# Patient Record
Sex: Female | Born: 1970 | Race: White | Hispanic: Yes | Marital: Single | Smoking: Never smoker
Health system: Southern US, Community
[De-identification: ages and names within clinical notes are randomized; demographics above are authoritative.]

## PROBLEM LIST (undated history)

## (undated) ENCOUNTER — Inpatient Hospital Stay (HOSPITAL_COMMUNITY): Payer: Self-pay

## (undated) DIAGNOSIS — G373 Acute transverse myelitis in demyelinating disease of central nervous system: Secondary | ICD-10-CM

## (undated) HISTORY — PX: NO PAST SURGERIES: SHX2092

## (undated) HISTORY — DX: Acute transverse myelitis in demyelinating disease of central nervous system: G37.3

---

## 2004-09-27 DIAGNOSIS — G0490 Encephalitis and encephalomyelitis, unspecified: Secondary | ICD-10-CM | POA: Insufficient documentation

## 2004-09-27 DIAGNOSIS — G0491 Myelitis, unspecified: Secondary | ICD-10-CM

## 2004-10-09 ENCOUNTER — Inpatient Hospital Stay (HOSPITAL_COMMUNITY): Admission: EM | Admit: 2004-10-09 | Discharge: 2004-10-13 | Payer: Self-pay | Admitting: Emergency Medicine

## 2004-10-09 ENCOUNTER — Ambulatory Visit: Payer: Self-pay | Admitting: Physical Medicine & Rehabilitation

## 2004-10-13 ENCOUNTER — Inpatient Hospital Stay (HOSPITAL_COMMUNITY)
Admission: RE | Admit: 2004-10-13 | Discharge: 2004-10-24 | Payer: Self-pay | Admitting: Physical Medicine & Rehabilitation

## 2004-10-27 ENCOUNTER — Ambulatory Visit: Payer: Self-pay | Admitting: Internal Medicine

## 2004-10-27 ENCOUNTER — Ambulatory Visit: Payer: Self-pay | Admitting: *Deleted

## 2004-11-13 ENCOUNTER — Ambulatory Visit: Payer: Self-pay | Admitting: Internal Medicine

## 2004-11-27 ENCOUNTER — Ambulatory Visit: Payer: Self-pay | Admitting: Internal Medicine

## 2004-12-02 ENCOUNTER — Encounter
Admission: RE | Admit: 2004-12-02 | Discharge: 2005-01-29 | Payer: Self-pay | Admitting: Physical Medicine & Rehabilitation

## 2004-12-03 ENCOUNTER — Ambulatory Visit: Payer: Self-pay | Admitting: Physical Medicine & Rehabilitation

## 2004-12-15 ENCOUNTER — Ambulatory Visit: Payer: Self-pay | Admitting: Internal Medicine

## 2005-01-29 ENCOUNTER — Encounter
Admission: RE | Admit: 2005-01-29 | Discharge: 2005-04-29 | Payer: Self-pay | Admitting: Physical Medicine & Rehabilitation

## 2005-01-30 ENCOUNTER — Ambulatory Visit: Payer: Self-pay | Admitting: Internal Medicine

## 2005-02-02 ENCOUNTER — Ambulatory Visit: Payer: Self-pay | Admitting: Physical Medicine & Rehabilitation

## 2005-03-27 ENCOUNTER — Ambulatory Visit: Payer: Self-pay | Admitting: Internal Medicine

## 2005-04-20 ENCOUNTER — Ambulatory Visit: Payer: Self-pay | Admitting: Internal Medicine

## 2005-04-27 ENCOUNTER — Encounter (INDEPENDENT_AMBULATORY_CARE_PROVIDER_SITE_OTHER): Payer: Self-pay | Admitting: Internal Medicine

## 2005-04-27 LAB — CONVERTED CEMR LAB

## 2005-05-04 ENCOUNTER — Encounter
Admission: RE | Admit: 2005-05-04 | Discharge: 2005-08-02 | Payer: Self-pay | Admitting: Physical Medicine & Rehabilitation

## 2005-05-15 ENCOUNTER — Ambulatory Visit: Payer: Self-pay | Admitting: Internal Medicine

## 2005-05-22 ENCOUNTER — Ambulatory Visit: Payer: Self-pay | Admitting: Internal Medicine

## 2005-05-22 DIAGNOSIS — A5601 Chlamydial cystitis and urethritis: Secondary | ICD-10-CM

## 2006-01-29 ENCOUNTER — Ambulatory Visit: Payer: Self-pay | Admitting: Family Medicine

## 2006-02-26 ENCOUNTER — Ambulatory Visit: Payer: Self-pay | Admitting: Internal Medicine

## 2006-03-11 ENCOUNTER — Ambulatory Visit: Payer: Self-pay | Admitting: Internal Medicine

## 2006-03-28 DIAGNOSIS — O039 Complete or unspecified spontaneous abortion without complication: Secondary | ICD-10-CM | POA: Insufficient documentation

## 2006-03-31 ENCOUNTER — Ambulatory Visit: Payer: Self-pay | Admitting: Obstetrics & Gynecology

## 2007-04-18 ENCOUNTER — Ambulatory Visit: Payer: Self-pay | Admitting: Family Medicine

## 2007-04-22 ENCOUNTER — Ambulatory Visit: Payer: Self-pay | Admitting: Internal Medicine

## 2007-04-22 DIAGNOSIS — J309 Allergic rhinitis, unspecified: Secondary | ICD-10-CM | POA: Insufficient documentation

## 2007-05-29 ENCOUNTER — Encounter (INDEPENDENT_AMBULATORY_CARE_PROVIDER_SITE_OTHER): Payer: Self-pay | Admitting: Internal Medicine

## 2007-05-29 LAB — CONVERTED CEMR LAB

## 2007-06-06 ENCOUNTER — Encounter: Payer: Self-pay | Admitting: Internal Medicine

## 2007-06-06 ENCOUNTER — Ambulatory Visit: Payer: Self-pay | Admitting: Internal Medicine

## 2007-09-14 ENCOUNTER — Encounter (INDEPENDENT_AMBULATORY_CARE_PROVIDER_SITE_OTHER): Payer: Self-pay | Admitting: *Deleted

## 2007-09-15 ENCOUNTER — Encounter (INDEPENDENT_AMBULATORY_CARE_PROVIDER_SITE_OTHER): Payer: Self-pay | Admitting: Internal Medicine

## 2007-09-15 DIAGNOSIS — R32 Unspecified urinary incontinence: Secondary | ICD-10-CM | POA: Insufficient documentation

## 2007-09-15 DIAGNOSIS — Z8659 Personal history of other mental and behavioral disorders: Secondary | ICD-10-CM

## 2008-02-02 ENCOUNTER — Ambulatory Visit: Payer: Self-pay | Admitting: Internal Medicine

## 2008-02-02 DIAGNOSIS — N319 Neuromuscular dysfunction of bladder, unspecified: Secondary | ICD-10-CM | POA: Insufficient documentation

## 2008-02-02 LAB — CONVERTED CEMR LAB
Bilirubin Urine: NEGATIVE
Blood in Urine, dipstick: NEGATIVE
Glucose, Urine, Semiquant: NEGATIVE
Ketones, urine, test strip: NEGATIVE
Nitrite: NEGATIVE
Protein, U semiquant: NEGATIVE
Specific Gravity, Urine: 1.01
Urobilinogen, UA: NEGATIVE
WBC Urine, dipstick: NEGATIVE
pH: 5.5

## 2008-04-02 ENCOUNTER — Telehealth (INDEPENDENT_AMBULATORY_CARE_PROVIDER_SITE_OTHER): Payer: Self-pay | Admitting: *Deleted

## 2008-04-20 ENCOUNTER — Ambulatory Visit: Payer: Self-pay | Admitting: Nurse Practitioner

## 2008-07-20 ENCOUNTER — Encounter (INDEPENDENT_AMBULATORY_CARE_PROVIDER_SITE_OTHER): Payer: Self-pay | Admitting: Internal Medicine

## 2008-07-20 ENCOUNTER — Ambulatory Visit: Payer: Self-pay | Admitting: Internal Medicine

## 2008-07-20 DIAGNOSIS — M79609 Pain in unspecified limb: Secondary | ICD-10-CM

## 2008-07-20 LAB — CONVERTED CEMR LAB
ALT: 16 units/L (ref 0–35)
AST: 18 units/L (ref 0–37)
Albumin: 4.2 g/dL (ref 3.5–5.2)
Alkaline Phosphatase: 60 units/L (ref 39–117)
BUN: 14 mg/dL (ref 6–23)
Basophils Absolute: 0 10*3/uL (ref 0.0–0.1)
Basophils Relative: 1 % (ref 0–1)
Bilirubin Urine: NEGATIVE
Blood in Urine, dipstick: NEGATIVE
CO2: 23 meq/L (ref 19–32)
Calcium: 8.9 mg/dL (ref 8.4–10.5)
Chlamydia, DNA Probe: NEGATIVE
Chloride: 107 meq/L (ref 96–112)
Cholesterol: 129 mg/dL (ref 0–200)
Creatinine, Ser: 0.72 mg/dL (ref 0.40–1.20)
Eosinophils Absolute: 0.1 10*3/uL (ref 0.0–0.7)
Eosinophils Relative: 1 % (ref 0–5)
GC Probe Amp, Genital: NEGATIVE
Glucose, Bld: 89 mg/dL (ref 70–99)
Glucose, Urine, Semiquant: NEGATIVE
HCT: 38.2 % (ref 36.0–46.0)
HDL: 47 mg/dL (ref >39)
Hemoglobin: 13 g/dL (ref 12.0–15.0)
KOH Prep: NEGATIVE
Ketones, urine, test strip: NEGATIVE
LDL Cholesterol: 62 mg/dL (ref 0–99)
Lymphocytes Relative: 28 % (ref 12–46)
Lymphs Abs: 1.8 10*3/uL (ref 0.7–4.0)
MCHC: 34 g/dL (ref 30.0–36.0)
MCV: 90.5 fL (ref 78.0–100.0)
Monocytes Absolute: 0.5 10*3/uL (ref 0.1–1.0)
Monocytes Relative: 8 % (ref 3–12)
Neutro Abs: 4.2 10*3/uL (ref 1.7–7.7)
Neutrophils Relative %: 63 % (ref 43–77)
Nitrite: NEGATIVE
Platelets: 172 10*3/uL (ref 150–400)
Potassium: 3.8 meq/L (ref 3.5–5.3)
Protein, U semiquant: 30
RBC: 4.22 M/uL (ref 3.87–5.11)
RDW: 13.1 % (ref 11.5–15.5)
Sodium: 141 meq/L (ref 135–145)
Specific Gravity, Urine: 1.01
Total Bilirubin: 0.6 mg/dL (ref 0.3–1.2)
Total CHOL/HDL Ratio: 2.7
Total Protein: 7.1 g/dL (ref 6.0–8.3)
Triglycerides: 99 mg/dL (ref <150)
Urobilinogen, UA: 2
VLDL: 20 mg/dL (ref 0–40)
WBC Urine, dipstick: NEGATIVE
WBC: 6.6 10*3/uL (ref 4.0–10.5)
Whiff Test: POSITIVE
pH: 8

## 2008-07-24 ENCOUNTER — Encounter (INDEPENDENT_AMBULATORY_CARE_PROVIDER_SITE_OTHER): Payer: Self-pay | Admitting: Internal Medicine

## 2008-07-26 ENCOUNTER — Encounter (INDEPENDENT_AMBULATORY_CARE_PROVIDER_SITE_OTHER): Payer: Self-pay | Admitting: Internal Medicine

## 2008-12-25 ENCOUNTER — Ambulatory Visit: Payer: Self-pay | Admitting: Internal Medicine

## 2009-01-07 ENCOUNTER — Telehealth (INDEPENDENT_AMBULATORY_CARE_PROVIDER_SITE_OTHER): Payer: Self-pay | Admitting: Internal Medicine

## 2009-01-10 ENCOUNTER — Encounter: Admission: RE | Admit: 2009-01-10 | Discharge: 2009-02-07 | Payer: Self-pay | Admitting: Internal Medicine

## 2009-01-14 ENCOUNTER — Encounter (INDEPENDENT_AMBULATORY_CARE_PROVIDER_SITE_OTHER): Payer: Self-pay | Admitting: Internal Medicine

## 2009-01-21 ENCOUNTER — Encounter (INDEPENDENT_AMBULATORY_CARE_PROVIDER_SITE_OTHER): Payer: Self-pay | Admitting: Internal Medicine

## 2009-01-29 ENCOUNTER — Encounter (INDEPENDENT_AMBULATORY_CARE_PROVIDER_SITE_OTHER): Payer: Self-pay | Admitting: Internal Medicine

## 2009-01-29 LAB — CONVERTED CEMR LAB: Pap Smear: NORMAL

## 2009-02-20 ENCOUNTER — Ambulatory Visit (HOSPITAL_COMMUNITY): Admission: RE | Admit: 2009-02-20 | Discharge: 2009-02-20 | Payer: Self-pay | Admitting: Family Medicine

## 2009-02-20 ENCOUNTER — Encounter (INDEPENDENT_AMBULATORY_CARE_PROVIDER_SITE_OTHER): Payer: Self-pay | Admitting: Internal Medicine

## 2009-03-05 ENCOUNTER — Ambulatory Visit (HOSPITAL_COMMUNITY): Admission: RE | Admit: 2009-03-05 | Discharge: 2009-03-06 | Payer: Self-pay

## 2009-04-16 ENCOUNTER — Ambulatory Visit (HOSPITAL_COMMUNITY): Admission: RE | Admit: 2009-04-16 | Discharge: 2009-04-16 | Payer: Self-pay | Admitting: Family Medicine

## 2009-05-06 ENCOUNTER — Ambulatory Visit (HOSPITAL_COMMUNITY): Admission: RE | Admit: 2009-05-06 | Discharge: 2009-05-06 | Payer: Self-pay | Admitting: Family Medicine

## 2009-07-26 ENCOUNTER — Encounter (INDEPENDENT_AMBULATORY_CARE_PROVIDER_SITE_OTHER): Payer: Self-pay | Admitting: Internal Medicine

## 2009-07-26 ENCOUNTER — Ambulatory Visit: Payer: Self-pay | Admitting: Obstetrics & Gynecology

## 2009-08-02 ENCOUNTER — Inpatient Hospital Stay (HOSPITAL_COMMUNITY): Admission: RE | Admit: 2009-08-02 | Discharge: 2009-08-04 | Payer: Self-pay | Admitting: Family Medicine

## 2009-08-02 ENCOUNTER — Ambulatory Visit: Payer: Self-pay | Admitting: Advanced Practice Midwife

## 2010-04-03 ENCOUNTER — Ambulatory Visit: Payer: Self-pay | Admitting: Internal Medicine

## 2010-04-22 ENCOUNTER — Encounter (INDEPENDENT_AMBULATORY_CARE_PROVIDER_SITE_OTHER): Payer: Self-pay | Admitting: Internal Medicine

## 2010-05-01 ENCOUNTER — Encounter (INDEPENDENT_AMBULATORY_CARE_PROVIDER_SITE_OTHER): Payer: Self-pay | Admitting: Internal Medicine

## 2010-06-16 ENCOUNTER — Ambulatory Visit: Payer: Self-pay | Admitting: Internal Medicine

## 2010-06-16 DIAGNOSIS — N920 Excessive and frequent menstruation with regular cycle: Secondary | ICD-10-CM

## 2010-06-19 LAB — CONVERTED CEMR LAB: hCG, Beta Chain, Quant, S: 900.2 milliintl units/mL

## 2010-06-23 ENCOUNTER — Ambulatory Visit: Payer: Self-pay | Admitting: Internal Medicine

## 2010-06-26 ENCOUNTER — Encounter (INDEPENDENT_AMBULATORY_CARE_PROVIDER_SITE_OTHER): Payer: Self-pay | Admitting: Internal Medicine

## 2010-06-26 LAB — CONVERTED CEMR LAB
Preg, Serum: POSITIVE
hCG, Beta Chain, Quant, S: 88.3 milliintl units/mL

## 2010-06-27 ENCOUNTER — Ambulatory Visit: Payer: Self-pay | Admitting: Internal Medicine

## 2010-06-27 DIAGNOSIS — O039 Complete or unspecified spontaneous abortion without complication: Secondary | ICD-10-CM | POA: Insufficient documentation

## 2010-07-14 ENCOUNTER — Encounter (INDEPENDENT_AMBULATORY_CARE_PROVIDER_SITE_OTHER): Payer: Self-pay | Admitting: Internal Medicine

## 2010-07-23 LAB — CONVERTED CEMR LAB: hCG, Beta Chain, Quant, S: 34.7 milliintl units/mL

## 2010-07-25 ENCOUNTER — Ambulatory Visit: Payer: Self-pay | Admitting: Internal Medicine

## 2010-07-26 LAB — CONVERTED CEMR LAB: hCG, Beta Chain, Quant, S: <2 milliintl units/mL

## 2011-01-27 NOTE — Letter (Signed)
Summary: REQUESTING RECORDS FROM [facility]  REQUESTING RECORDS FROM [facility]   Imported By: Arta Bruce 05/06/2010 14:42:48  _____________________________________________________________________  External Attachment:    Type:   Image     Comment:   External Document

## 2011-01-27 NOTE — Assessment & Plan Note (Signed)
Summary: *? MISCARRIAGE  / NS   Vital Signs:  Patient profile:   40 year old female LMP:     06/16/2010 Weight:      152 pounds Temp:     98.5 degrees F Pulse rate:   65 / minute Pulse rhythm:   regular Resp:     20 per minute BP sitting:   93 / 63  (left arm) Cuff size:   regular  Vitals Entered By: Vesta Mixer CMA (June 16, 2010 3:21 PM) CC: Had a miscarrage apparently ans was told to f/u here from the hosp.  [facility] hospital Is Patient Diabetic? No Pain Assessment Patient in pain? no       Does patient need assistance? Ambulation Normal LMP (date): 06/16/2010     Enter LMP: 06/16/2010 Last PAP Result Done   CC:  Had a miscarrage apparently ans was told to f/u here from the hosp.  [facility] hospital.  History of Present Illness: Pt. seen yesterday at [facility] ED for excessive uterine bleeding--lots of clots.  Pt. received a Depo provera injection back in April.  Pt. was told she had a positive pregnancy test, but ultrasound did not show anything per boyfriend.  Bleeding still more than usual, but not nearly as bad as yesterday.  Some cramping in low back bilaterally.  Allergies (verified): No Known Drug Allergies  Physical Exam  General:  NAD Lungs:  Normal respiratory effort, chest expands symmetrically. Lungs are clear to auscultation, no crackles or wheezes. Heart:  Normal rate and regular rhythm. S1 and S2 normal without gallop, murmur, click, rub or other extra sounds. Abdomen:  Bowel sounds positive,abdomen soft and non-tender without masses, organomegaly or hernias noted.  No obvious uterine enlargement   Impression & Recommendations:  Problem # 1:  EXCESSIVE MENSTRUAL BLEEDING (ICD-626.2) Will need to send for records--sound like an ultrasound was done that did not show "anything"  per boyfriend.   Orders: T-Pregnancy (Serum), Quant. [phone])  Complete Medication List: 1)  Flonase 50 Mcg/act Susp (Fluticasone propionate) .... 2 sprays  each nostril  daily 2)  Neurontin 300 Mg Caps (Gabapentin) .... Increase by one cap every 3 days until taking 1 cap by mouth three times a day 3)  Singulair 10 Mg Tabs (Montelukast sodium) .Marland Kitchen.. 1 tab by mouth daily as needed seasonal allergies 4)  Cetirizine Hcl 10 Mg Tabs (Cetirizine hcl) .Marland Kitchen.. 1 tab daily  Patient Instructions: 1)  Release of info from [facility] ED--for uterine bleeding. 2)  Ibuprofen or Aleve for cramping. 3)  Use condoms until follow up here in July

## 2011-01-27 NOTE — Assessment & Plan Note (Signed)
Summary: follow up with Dr Breck Coons myesitus and next depo...   Vital Signs:  Patient profile:   40 year old female Weight:      152 pounds Temp:     98.1 degrees F Pulse rate:   73 / minute Pulse rhythm:   regular Resp:     18 per minute BP sitting:   101 / 67  (left arm) Cuff size:   regular  Vitals Entered By: Vesta Mixer CMA (June 27, 2010 3:15 PM) CC: f/u transverse myelitis/depo Is Patient Diabetic? No Pain Assessment Patient in pain? no       Does patient need assistance? Ambulation Normal   CC:  f/u transverse myelitis/depo.  History of Present Illness: 1.  SAB:  HCG dropping--from 900 to 88.5 in a week.  Have not seen paperwork from her hospital visit yet.  Bleeding is decreasing gradually  Allergies (verified): No Known Drug Allergies  Physical Exam  General:  NAD   Impression & Recommendations:  Problem # 1:  ABORTION, SPONTANEOUS (ICD-634.90) When reaches 0 or near 0, will start OCP--pt. has no requests for a certain pill Orders: T-Pregnancy (Serum), Quant. [phone])  Complete Medication List: 1)  Flonase 50 Mcg/act Susp (Fluticasone propionate) .... 2 sprays each nostril  daily 2)  Neurontin 300 Mg Caps (Gabapentin) .... Increase by one cap every 3 days until taking 1 cap by mouth three times a day 3)  Singulair 10 Mg Tabs (Montelukast sodium) .Marland Kitchen.. 1 tab by mouth daily as needed seasonal allergies 4)  Cetirizine Hcl 10 Mg Tabs (Cetirizine hcl) .Marland Kitchen.. 1 tab daily  Patient Instructions: 1)  Call [phone] and ask for [facility] to see if can get in there (husband)  Ask them if they do vasectomies--if so, see if qualify to be seen there. 2)  Will call with results and whether can start birth control pills

## 2011-01-27 NOTE — Assessment & Plan Note (Signed)
Summary: renew medication///gk   Vital Signs:  Patient profile:   40 year old female Weight:      155.38 pounds BMI:     28.99 Temp:     98.1 degrees F Pulse rate:   57 / minute Pulse rhythm:   regular Resp:     20 per minute BP sitting:   105 / 70  (left arm) Cuff size:   regular  Vitals Entered By: Chauncy Passy, SMA CC: Pt. here b/c she wants to renew/change some meds. She states she would like to have the depo shot instead of the pills.  Is Patient Diabetic? No Pain Assessment Patient in pain? no       Does patient need assistance? Functional Status Self care Ambulation Normal   CC:  Pt. here b/c she wants to renew/change some meds. She states she would like to have the depo shot instead of the pills. .  History of Present Illness: 1.  Allergies:  Controlled with Allegra, which she is out of, Singulair, and Flonase.  Needs refills of meds.  2.  Transverse Myelitis:  Neurontin helps with muscle spasms--would like to restart.  Has not taken since became pregnant with 39 mos old son.  Also suffers from burning and cold in feet.  Neurontin helped with that as well.  Continues with urine and stool incontinence--felt that was also better on Neurontin--sounds like has more of urge incontinence--unable to sustain sphincter pressure.    3.  Birth Control:  Not takiing any birth control currently and would like to get on Depo Provera--has used in years past without any problems.  Has not had intercourse recently.  Last gynecological exam--9/10 at [facility]  Current Medications (verified): 1)  Desogen 0.15-30 Mg-Mcg  Tabs (Desogestrel-Ethinyl Estradiol) .Marland Kitchen.. 1 Tab By Mouth Daily As Directed 2)  Flonase 50 Mcg/act  Susp (Fluticasone Propionate) .... 2 Sprays Each Nostril  Daily 3)  Allegra 180 Mg  Tabs (Fexofenadine Hcl) .Marland Kitchen.. 1 Tablet By Mouth Daily 4)  Neurontin 300 Mg  Caps (Gabapentin) .... Increase By One Cap Every 3 Days Until Taking 2 Caps By Mouth Three Times A Day 5)  Singulair 10  Mg  Tabs (Montelukast Sodium) .Marland Kitchen.. 1 Tab By Mouth Daily As Needed Seasonal Allergies 6)  Prenatal Vitamins 0.8 Mg Tabs (Prenatal Multivit-Min-Fe-Fa) .Marland Kitchen.. 1 Tab By Mouth Daily  Allergies (verified): No Known Drug Allergies  Physical Exam  General:  NAD Lungs:  Normal respiratory effort, chest expands symmetrically. Lungs are clear to auscultation, no crackles or wheezes. Heart:  Normal rate and regular rhythm. S1 and S2 normal without gallop, murmur, click, rub or other extra sounds.  Radial pulses normal and equal. Abdomen:  Bowel sounds positive,abdomen soft and non-tender without masses, organomegaly or hernias noted. Neurologic:  alert & oriented X3 and cranial nerves II-XII intact.  Motor strength in bilateral lower estrems 4+/5.  DTRs in LE 3+/4--particularly patellar.  Ankle reflexes 2+/4   Impression & Recommendations:  Problem # 1:  ALLERGIC RHINITIS (ICD-477.9) Refill meds and switch to Cetirizine as Allegra no longer available The following medications were removed from the medication list:    Allegra 180 Mg Tabs (Fexofenadine hcl) .Marland Kitchen... 1 tablet by mouth daily Her updated medication list for this problem includes:    Flonase 50 Mcg/act Susp (Fluticasone propionate) .Marland Kitchen... 2 sprays each nostril  daily    Cetirizine Hcl 10 Mg Tabs (Cetirizine hcl) .Marland Kitchen... 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 1 tab by mouth daily as directed 2)  Flonase 50 Mcg/act Susp (Fluticasone propionate) .... 2 sprays each nostril  daily 3)  Neurontin 300 Mg Caps (Gabapentin) .... Increase by one cap every 3 days until taking 1 cap by mouth three times a day 4)  Singulair 10 Mg Tabs (Montelukast sodium) .Marland Kitchen.. 1 tab by mouth daily as needed seasonal allergies 5)  Cetirizine Hcl 10 Mg Tabs (Cetirizine hcl) .Marland Kitchen.. 1 tab daily  Other Orders: Urine Pregnancy Test   [phone])  Patient Instructions: 1)  Release of info for gyn exam at [facility]--should be in September 2)  Follow up with Dr. Delrae Alfred in 3 months --Transverse myelitis and next Depo provera 3)  CPP end of September/beginning of October Prescriptions: NEURONTIN 300 MG  CAPS (GABAPENTIN) Increase by one cap every 3 days until taking 1 cap by mouth three times a day  #90 x 11   Entered and Authorized by:   Julieanne Manson MD   Signed by:   Julieanne Manson MD on 04/03/2010   Method used:   Faxed to ...       [facility] (retail)       [address] [place], [place]  [address]       Ph: [phone]       Fax: [phone]   RxID:   [id number] CETIRIZINE HCL 10 MG TABS (CETIRIZINE HCL) 1 tab daily  #30 x 11   Entered and Authorized by:   Julieanne Manson MD   Signed by:   Julieanne Manson MD on 04/03/2010   Method used:   Faxed to ...       [facility] (retail)       [address] [place], [place]  [address]       Ph: [phone]       Fax: [phone]   RxID:   [id number]   Appended Document: renew medication///gk     Allergies: No Known Drug Allergies   Impression & Recommendations:  Problem # 1:  CONTRACEPTIVE MANAGEMENT (ICD-V25.09)  Urine pregnancy--if negative--start Depo provera  Orders: Admin of Therapeutic Inj  intramuscular or subcutaneous (73419) Depo-Provera 150mg  (J1055)  Complete Medication List: 1)  Flonase 50 Mcg/act Susp (Fluticasone propionate) .... 2 sprays each nostril  daily 2)  Neurontin 300 Mg Caps (Gabapentin) .... Increase by one cap every 3 days until taking 1 cap by mouth three times a day 3)  Singulair 10 Mg Tabs (Montelukast sodium) .Marland Kitchen.. 1 tab by mouth daily as needed seasonal allergies 4)  Cetirizine Hcl 10 Mg Tabs (Cetirizine hcl) .Marland Kitchen.. 1 tab daily  Laboratory Results   Urine Tests      Urine HCG:  negative      Medication Administration  Injection # 1:    Medication: Depo-Provera 150mg     Diagnosis: CONTRACEPTIVE MANAGEMENT (ICD-V25.09)    Route: IM    Site: L deltoid    Exp Date: 05/27/2012    Lot #: [id number]    Mfr: Pharmacia    Comments: NDC - 3790-2409-73 Next shot due June 29    Patient tolerated injection without complications    Given by: Chauncy Passy SMA  Orders Added: 1)  Admin of Therapeutic Inj  intramuscular or subcutaneous [96372] 2)  Depo-Provera 150mg  [J1055]

## 2011-02-23 ENCOUNTER — Encounter (INDEPENDENT_AMBULATORY_CARE_PROVIDER_SITE_OTHER): Payer: Self-pay | Admitting: Internal Medicine

## 2011-02-23 DIAGNOSIS — D696 Thrombocytopenia, unspecified: Secondary | ICD-10-CM | POA: Insufficient documentation

## 2011-03-05 NOTE — Letter (Signed)
Summary: RECORDS RECEIVED FROM [facility] RECEIVED FROM [facility]   Imported By: Arta Bruce 02/24/2011 12:45:13  _____________________________________________________________________  External Attachment:    Type:   Image     Comment:   External Document

## 2011-03-05 NOTE — Miscellaneous (Signed)
Summary: [facility] records review.  Clinical Lists Changes  Problems: Added new problem of History of  THROMBOCYTOPENIA (ICD-287.5) Observations: Added new observation of PAP SMEAR: Normal at [facility] (01/29/2009 22:46)

## 2011-04-04 LAB — CBC
HCT: 31 % — ABNORMAL LOW (ref 36.0–46.0)
HCT: 37.1 % (ref 36.0–46.0)
Hemoglobin: 10.9 g/dL — ABNORMAL LOW (ref 12.0–15.0)
Hemoglobin: 12.8 g/dL (ref 12.0–15.0)
MCHC: 34.6 g/dL (ref 30.0–36.0)
MCHC: 35.2 g/dL (ref 30.0–36.0)
MCV: 97.9 fL (ref 78.0–100.0)
MCV: 98.3 fL (ref 78.0–100.0)
Platelets: 92 10*3/uL — ABNORMAL LOW (ref 150–400)
Platelets: 98 10*3/uL — ABNORMAL LOW (ref 150–400)
RBC: 3.16 MIL/uL — ABNORMAL LOW (ref 3.87–5.11)
RBC: 3.79 MIL/uL — ABNORMAL LOW (ref 3.87–5.11)
RDW: 14.4 % (ref 11.5–15.5)
RDW: 14.7 % (ref 11.5–15.5)
WBC: 8.3 10*3/uL (ref 4.0–10.5)
WBC: 9 10*3/uL (ref 4.0–10.5)

## 2011-04-04 LAB — RPR: RPR Ser Ql: NONREACTIVE

## 2011-05-15 ENCOUNTER — Other Ambulatory Visit (HOSPITAL_COMMUNITY): Payer: Self-pay | Admitting: Internal Medicine

## 2011-05-15 ENCOUNTER — Other Ambulatory Visit: Payer: Self-pay | Admitting: Internal Medicine

## 2011-05-15 DIAGNOSIS — N6459 Other signs and symptoms in breast: Secondary | ICD-10-CM

## 2011-05-15 DIAGNOSIS — Z1231 Encounter for screening mammogram for malignant neoplasm of breast: Secondary | ICD-10-CM

## 2011-05-15 NOTE — Assessment & Plan Note (Signed)
[name] returns to the clinic today for followup evaluation after a recent  bout of transverse myelitis.  She was on the rehabilitation unit between  October 13, 2004 and October 24, 2004 after initial onset October 08, 2004.   At this time, the patient reports that she still has tightness of her low  back and into her buttocks bilaterally. She reports that the tightness is  present all the time.  She does report that she feels like she has to walk  cautiously as she has weakness in her legs and could fall periodically. She  reports that she had fallen last week but did not injure herself. She does  report that the swollen feeling that she has across her abdomen and her  pelvis is less evident than it was previously. All of this is explained in  Spanish as she is not conversant in [place].   MEDICATIONS:  Neurontin 300 mg q.d.   PHYSICAL EXAMINATION:  A well appearing fit adult female in mild acute  discomfort. Blood pressure 85/58 with pulse of 85, respiratory rate 16 and  O2 saturation 100% on room air.  She has very poor [place] skills and we  discussed most of her problems in Spanish in the office today. There is only  mild problem understanding her when she speaks quickly.  She does have 5-/5  strength throughout the bilateral upper and lower extremities. Bulk and tone  were normal.  Sensation was only slightly decreased to light touch  throughout the bilateral lower extremities.   IMPRESSION:  Recent transverse myelitis, improved.   In the clinic today, we did refill her Neurontin at 300 mg q.d.  I have  cautioned her about rapid recovery and the fact that she will still be  recovering probably several months from now. With that in place, we have  recommended a followup in approximately 2-3 months time. She initially had  her onset in October of 2005 and I would expect her to continue to make  improvement through October 2006. Will plan on seeing her in followup as  noted  above.      DC/MedQ  D:  02/02/2005 11:57:55  T:  02/02/2005 12:36:34  Job #:  [id number]

## 2011-05-15 NOTE — Op Note (Signed)
NAMEMACKENZI, [name]                 ACCOUNT NO.:  [id number]   MEDICAL RECORD NO.:  [id number]          PATIENT TYPE:  EMS   LOCATION:  MAJO                         FACILITY:  [facility]   PHYSICIAN:  Michael L. Reynolds, M.D.DATE OF BIRTH:  [birth year]   DATE OF PROCEDURE:  10/09/2004  DATE OF DISCHARGE:                                 OPERATIVE REPORT   PROCEDURE:  Diagnostic lumbar puncture.   INDICATIONS:  Paraparesis, suspected myelitis.   OPERATOR:  Michael L. Reynolds, M.D.   DESCRIPTION OF PROCEDURE:  Informed consent was obtained after the procedure  risks and benefits were explained to the patient and she agreed to proceed.  Interpreter was present for this discussion.  The patient was placed in the  right lateral decubitus position and prepped and draped in the usual sterile  fashion.  Local anesthesia was achieved at the L3-4 interspace with 2 mL of  lidocaine.  Several passes with a 20-gauge spinal needle were made at the L3-  4 level without return of CSF.  The patient was then reanesthetized with 3  mL of lidocaine at the L4-5 level and on the first pass of the spinal needle  at this level, clear CSF was obtained.  Opening pressure was 135 mmH2O.  Approximately 8 mL of fluid was withdrawn and sent for laboratory analysis  as follows:  Tube #1, 2 mL, cell count and differential; tube #2, 2 mL,  glucose, protein, oligoclonal banding analysis; tube #3, 2 mL, Gram stain  and routine culture; tube #4, 2 mL, to be held in the lab for further  testing as needed.  Closing pressure was measured at 75 mmH2O.  The needle  was withdrawn.  Hemostasis was obtained.  No immediate complications were  noted.       MLR/MEDQ  D:  10/09/2004  T:  10/09/2004  Job:  [id number]

## 2011-05-15 NOTE — Discharge Summary (Signed)
NAMECHRISHAWNA, FARINA                 ACCOUNT NO.:  [id number]   MEDICAL RECORD NO.:  [id number]          PATIENT TYPE:  INP   LOCATION:  3034                         FACILITY:  [facility]   PHYSICIAN:  Casimiro Needle L. 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   DISCHARGE DIAGNOSES:  1.  Transverse myelitis.  2.  Neuropathic pain secondary to #1.  3.  Urinary urgency secondary to #1.  4.  Mild spasticity secondary to #1.   CONDITION AT DISCHARGE:  Improved.   DIET:  Regular.   ACTIVITY:  Activity is ad lib per rehab team.   MEDICATIONS AT DISCHARGE:  1.  Prednisone 60 mg p.o. daily.  2.  Protonix 40 mg p.o. daily.  3.  Lovenox 40 mg subcu daily.  4.  Baclofen 5 mg t.i.d.   STUDIES:  1.  MRI of the thoracic spine, October 09, 2004:  Normal examination.  2.  CT of the lumbosacral spine, October 08, 2004:  Unremarkable.  3.  MRI of the brain with and without contrast, October 09, 2004:  Normal.  4.  MRI of the thoracic spine, October 12, 2004:  Normal.   LABORATORY REVIEW:  CBC normal on admission.  Routine chemistries are  remarkable for elevated glucose due to steroids, elevated chloride levels of  uncertain etiology.  TSH normal.  HIV negative.  Urine drug screen negative.  LP demonstrating 4 white cells and 120 red blood cells, normal protein,  normal glucose, negative oligoclonal bands, negative cultures.  RPR  negative.  ANA negative.   HOSPITAL COURSE:  Please see admission H&P for full admission details.  Briefly, this is a 40 year old woman with little past medical history, who  presented with a subacute paraparesis.  On clinical grounds, she was felt to  have transverse myelitis; imaging studies and LP, however, did not  demonstrate any particular evidence for this.  She was treated with  intravenous steroids for 5 days.  She had complained of some urinary  retention as well as some neuropathic pain and mild spasticity which were  treated symptomatically.  By the 5th day of IV steroids, she was feeling  somewhat better; also, she was showing some improvement on examination,  although she still have leg weakness and bilateral Babinski's signs.  She  was felt to be stable for rehab transfer and was transferred to rehab on  October 13, 2004.   FOLLOWUP:  She will follow up with her rehab physicians as scheduled and  once discharged from rehab, will follow up at [facility]  in several weeks.       MLR/MEDQ  D:  11/27/2004  T:  11/27/2004  Job:  [id number]

## 2011-05-15 NOTE — Assessment & Plan Note (Signed)
HISTORY OF PRESENT ILLNESS:  [name] returns to the clinic today for  followup evaluation accompanied by her husband. The patient is a 40 year old  female with an insignificant past medical history. She was admitted on  October 08, 2004 with numbness across her lower abdomen and hip region along  with a gait disorder. MRI scan of the thoracic spine was negative. Cerebral  spinal fluid showed a protein of 21 with a glucose of 45 and sedimentation  rate of 22. Drug screen was negative. Neurology was consulted and Dr.  Thad Ranger suspected transverse myelitis. She was started on an intravenous  steroid protocol. She was also placed on subcutaneous Lovenox for deep vein  thrombosis. The patient eventually stabilized and was moved to the  rehabilitation unit on October 13, 2004. She remained there through  discharge on October 24, 2004. Since discharge, the patient has been at home  living with her husband. She has continued with therapies and presently  ambulates without any assistive device. She is independent with bathing and  dressing. She has almost weaned off the prednisone, although she has  approximately 5 more days. She continues to take her Protonix. She  apparently has not been using her Neurontin medication. She does complain of  tightness and pulling of her bilateral lower extremities, especially when  she is lying. That is less evident for her during the day when she is up  walking or standing. The symptoms are more prevalent on the left than on the  right. She still reports some urgency with voiding but feels that she is  emptying completely. 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 The patient has not followed up with Dr.  Thad Ranger her neurologist, and no appointment is scheduled to date. She dose  complain of poor sleep in the office today. She also need a slip from me  stating exactly when she was hospitalized.   MEDICATIONS:  Protonix  40 mg q.d., prednisone taper to finish next week.   PHYSICAL EXAMINATION:  GENERAL:  A reasonably appearing adult female in no  acute discomfort. She does report that the pain is really not present or the  tightness is not present in her lower extremities today but is more  prevalent at night. She ambulates without any assistive device. Strength was  5 minus over 5 throughout the bilateral upper and lower extremities. Bulk  and tone were normal. Sensation was slightly decreased to light touch  throughout the bilateral lower extremities. She did complain of some mild  mid lumbar back pain, which is sporadic.   IMPRESSION:  Recent diagnosis of transverse myelitis, improved.   PLAN:  I did give the patient a slip today stating that she was actually  hospitalized between October 08, 2004 and October 24, 2004. We also  discussed return to work issues but her job involves standing and walking on  a hard floor at least 8 to 10 hours per day. She cannot return to that at  the present time but she reports that she is sure that she will have a job  when she is able to in the future. We did start her on Trazodone 50 mg  nightly for sleep. I also asked her to restart her Neurontin at 100 mg  t.i.d. She has made substantial improvement compared to when she was  hospitalized. She still has some tightness and pulling of her bilateral  lower extremities, especially at night but I  suspect that that will continue  to improve. I would expect that she would make a full recovery from this  bout of this transverse myelitis. I have encouraged her to followup with Dr.  Thad Ranger, her neurologist.   FOLLOW UP:  I will plan on seeing the patient in followup in this office in  approximately 2 months time.       DC/MedQ  D:  12/04/2004 09:40:23  T:  12/04/2004 15:49:52  Job #:  [id number]

## 2011-05-15 NOTE — Discharge Summary (Signed)
[name], [name] NO.:  [id number]   MEDICAL RECORD NO.:  [id number]          PATIENT TYPE:  IPS   LOCATION:  4005                         FACILITY:  [facility]   PHYSICIAN:  Ellwood Dense, M.D.   DATE OF BIRTH:  May 22, 1971   DATE OF ADMISSION:  10/13/2004  DATE OF DISCHARGE:  10/24/2004                                 DISCHARGE SUMMARY   DISCHARGE DIAGNOSES:  1.  Transverse myelitis.  2.  Steroid protocol.  3.  Subcutaneous Lovenox for deep venous thrombosis prophylaxis.   HISTORY OF PRESENT ILLNESS:  This is a 40 year old female.  Unremarkable  past medical history.  Admitted October 13 with numbness across lower  abdomen and hip area with gait disorder.  MRI of thoracic spine negative.  Cerebral spinal fluid:  Protein 21, glucose 45, sedimentation rate 22.  Drug  screen negative.  Neurology consult, Dr. Thad Ranger.  Suspect transverse  myelitis.  Intravenous steroid protocol initiated.  Subcutaneous Lovenox was  added for deep venous thrombosis prophylaxis.  She was admitted for a  comprehensive rehabilitation program.   PAST MEDICAL HISTORY:  Negative.   ALLERGIES:  None.   SOCIAL HISTORY:  Denies alcohol or tobacco.  Lives in [place] with  family.  Independent prior to admission.  Employed with a local glue  factory.  Mobile home, three steps to entry.  Family works day shift.   HOSPITAL COURSE:  Patient with progressive gains while on rehabilitation  services with therapies initiated on a b.i.d. basis.  The following issues  were followed during patient's rehabilitation course.  Pertaining to Ms.  [name] transverse myelitis, she continued on steroid tapering protocol per Dr.  Thad Ranger of neurology services, now presently on 40 mg daily.  She was using  Baclofen for spasms with relative good results.  Her functional status  continued to improve throughout her comprehensive rehabilitation course.  She was now ambulating supervision rolling walker 175  feet, modified  independence for her activities of daily living.  There was some barriers  due to her language barriers.  She was on subcutaneous Lovenox for deep  venous thrombosis prophylaxis.  This was discontinued at time of discharge.  Her calves remained cool without any swelling, erythema, nontender.  Foley  catheter tube had been removed.  She was voiding without difficulty.  No  bowel disturbances noted.  Ultimate plan was to be discharged to home with  family.   LABORATORIES:  Sodium 141, potassium 3.9, BUN 17, creatinine 0.8, hemoglobin  12.7, hematocrit 36.4.   DISCHARGE MEDICATIONS:  1.  Prednisone 40 mg daily.  Taper would be adjusted as per neurology      services.  2.  Neurontin 100 mg t.i.d.  3.  Protonix 40 mg daily until steroid completed.  4.  Tylenol as needed.   ACTIVITY:  As tolerated.   DIET:  Regular.   SPECIAL INSTRUCTIONS:  As dictated per rehabilitation services.  She would  see Dr. Ellwood Dense in the rehabilitation service office in approximately  six weeks.  Continue to follow up per Dr. Thad Ranger of neurology services.  DA/MEDQ  D:  10/23/2004  T:  10/23/2004  Job:  [id number]   cc:   Casimiro Needle L. Thad Ranger, M.D.  [address]  [address]  [place]  [place] [address]  Fax: [phone]

## 2011-05-15 NOTE — H&P (Signed)
[name], [name]                 ACCOUNT NO.:  [id number]   MEDICAL RECORD NO.:  [id number]          PATIENT TYPE:  EMS   LOCATION:  MAJO                         FACILITY:  [facility]   PHYSICIAN:  Michael L. Reynolds, M.D.DATE OF BIRTH:  1971/08/09   DATE OF ADMISSION:  10/08/2004  DATE OF DISCHARGE:                                HISTORY & PHYSICAL   CHIEF COMPLAINT:  Leg weakness.   HISTORY OF PRESENT ILLNESS:  This is the initial [facility]  assessment admission for this 40 year old woman with no past medical  history.  Initially reports that yesterday (October 07, 2004) she awakened  and noticed numbness across the lower abdomen and hip area and had some  difficulty walking, as if her feet were heavy and clumsy.  The symptoms  progressed throughout the day to the point that she had to leave work.  On  the morning of October 08, 2004, she awoke with increased weakness in the  legs and was unable to walk about without assistance.  She came to the [facility] Emergency Room for further evaluation and subsequent neurologic  consultation is requested.  There is no previous history of similar  symptoms.  She denies associated back pain.  She has had some urinary  urgency.  She denies any problems with her face, vision, or upper  extremities.   PAST MEDICAL HISTORY:  She denies any chronic medical problems or previous  surgeries.   FAMILY HISTORY:  Remarkable for breast cancer and leukemia.   SOCIAL HISTORY:  She works in a Designer, industrial/product.  She denies the use of  alcohol, tobacco, or illicit drugs.   ALLERGIES:  Denies.   MEDICATIONS:  None.   REVIEW OF SYSTEMS:  A full ten system review of systems is negative except  as noted in the HPI and in the emergency room and admission nursing records.   PHYSICAL EXAMINATION:  VITAL SIGNS:  Temperature 100.2, blood pressure  99/65, pulse 98, respirations 18.  GENERAL:  This is a healthy-appearing woman, supine in the hospital  bed, in  no evident distress.  HEAD:  Cranium is normocephalic and atraumatic.  THROAT:  Oropharynx is benign.  NECK:  Supple without carotid bruits.  HEART:  Regular rate and rhythm without murmurs.  CHEST:  Clear to auscultation bilaterally.  ABDOMEN:  Soft.  Normoactive bowel sounds.  No hepatomegaly.  EXTREMITIES:  Pulses 2+.  No edema.  NEUROLOGIC:  Mental status:  She is awake and alert.  She is able to follow  simple and complex commands through a Spanish interpreter.  Her speech seems  appropriate and is not dysarthric.  Mood is euthymic and affect appropriate.  Cranial nerves:  Funduscopic exam is benign.  Pupils are equal and briskly  reactive.  Extraocular movements are full without nystagmus.  Visual fields  are full to confrontation.  Face, tongue, and palate move normally and  symmetrically.  Facial sensation is intact to pinprick.  Motor:  Normal bulk  and tone.  Normal strength in the upper extremity.  In the lower extremity,  there is  mild to moderate weakness of the hip flexors, knee extensors, and  dorsiflexors of the ankle to a lesser extent the plantar flexors at the  ankle.  Sensation:  She reports diminished pinprick sensation over the lower  extremities and a sensory level at about the T10 level.  She also has some  loss of proprioceptive sensation in the lower extremities.  Reflexes 2+ in  the upper extremities, 1+ in the lower extremities.  Toes are neutral.  Coordination:  Finger-to-nose is performed adequately.  She is unable to  perform heel-to-shin maneuvers.  Gait is deferred.   LABORATORY REVIEW:  B-MET remarkable for an elevated chloride, greater than  130, a low potassium of 3.2, elevated glucose of 124.  Sodium is normal.  BUN and creatinine are normal.  Urine drug screen is negative.  Urinalysis  reveals a few white cells, otherwise unremarkable.  CSF studies including  cell count, glucose, protein, and oligoclonal banding studies are pending.   MRI  of the thoracic spine was performed this evening with contrast and the  study does not reveal any significant abnormalities.   IMPRESSION:  Subacute paraparesis.  Imaging is negative for a compressive  lesion, suspect a transverse myelitis.   PLAN:  1.  We will admit for further observation and treatment.  2.  We will treat with IV steroids pending the CSF results.  3.  We will also check an MRI of the brain to further workup possible for      multiple sclerosis.  4.  She will need physical and occupational therapy evaluations and likely a      rehab stay.       MLR/MEDQ  D:  10/09/2004  T:  10/09/2004  Job:  [id number]

## 2011-05-18 ENCOUNTER — Ambulatory Visit
Admission: RE | Admit: 2011-05-18 | Discharge: 2011-05-18 | Disposition: A | Discharge status: Home / Self Care | Payer: PRIVATE HEALTH INSURANCE | Source: Ambulatory Visit | Attending: Internal Medicine | Admitting: Internal Medicine

## 2011-05-18 ENCOUNTER — Other Ambulatory Visit: Payer: Self-pay | Admitting: Internal Medicine

## 2011-05-18 DIAGNOSIS — N6459 Other signs and symptoms in breast: Secondary | ICD-10-CM

## 2013-07-26 ENCOUNTER — Ambulatory Visit: Payer: No Typology Code available for payment source | Attending: Family Medicine

## 2013-08-02 ENCOUNTER — Other Ambulatory Visit: Payer: Self-pay | Admitting: Family Medicine

## 2013-08-02 ENCOUNTER — Ambulatory Visit: Payer: No Typology Code available for payment source | Attending: Family Medicine | Admitting: Family Medicine

## 2013-08-02 VITALS — BP 107/71 | HR 62 | Temp 98.6°F | Resp 16 | Ht 62.0 in | Wt 167.0 lb

## 2013-08-02 DIAGNOSIS — G0489 Other myelitis: Secondary | ICD-10-CM | POA: Insufficient documentation

## 2013-08-02 DIAGNOSIS — G0491 Myelitis, unspecified: Secondary | ICD-10-CM

## 2013-08-02 DIAGNOSIS — N319 Neuromuscular dysfunction of bladder, unspecified: Secondary | ICD-10-CM

## 2013-08-02 DIAGNOSIS — Z87898 Personal history of other specified conditions: Secondary | ICD-10-CM

## 2013-08-02 DIAGNOSIS — Z1239 Encounter for other screening for malignant neoplasm of breast: Secondary | ICD-10-CM

## 2013-08-02 MED ORDER — GABAPENTIN 300 MG PO CAPS: 300.0000 mg | ORAL_CAPSULE | Freq: Three times a day (TID) | ORAL | Status: DC

## 2013-08-02 NOTE — Progress Notes (Signed)
Patient presents to establish care; states she needs refill on neuronin for numbness and tingling in back and legs; Patient states she was diagnosed with Transverse Myelitis 9 years ago.  Also wants Physical with PAP; I informed patient that we will make an appointment for her to return in one week to do the physical for PAP.

## 2013-08-02 NOTE — Progress Notes (Signed)
Patient ID: [name], female   DOB: [birth year], 42 y.o.   MRN: [id number]  CC:  Chief Complaint  Patient presents with  . Establish Care  . Medication Management  Interpreter used to communicate directly with patient for entire encounter including providing detailed patient instructions  HPI: Pt is presenting as a new patient.  She has a history of transverse myelitis but has not been taking her medication for the last year.  She had been taking gabapentin and it did help her symptoms.  She has not seen a neurologist for years.  She would like to start taking the gabapentin again.  She would also like a mammogram.   No Known Allergies Past Medical History  Diagnosis Date  . Transverse myelitis    No current outpatient prescriptions on file prior to visit.   No current facility-administered medications on file prior to visit.   History reviewed. No pertinent family history. History   Social History  . Marital Status: Single    Spouse Name: N/A    Number of Children: N/A  . Years of Education: N/A   Occupational History  . Not on file.   Social History Main Topics  . Smoking status: Never Smoker   . Smokeless tobacco: Not on file  . Alcohol Use: No  . Drug Use: No  . Sexually Active: Not on file   Other Topics Concern  . Not on file   Social History Narrative  . No narrative on file    Review of Systems  Constitutional: Negative for fever, chills, diaphoresis, activity change, appetite change and fatigue.  HENT: Negative for ear pain, nosebleeds, congestion, facial swelling, rhinorrhea, neck pain, neck stiffness and ear discharge.   Eyes: Negative for pain, discharge, redness, itching and visual disturbance.  Respiratory: Negative for cough, choking, chest tightness, shortness of breath, wheezing and stridor.   Cardiovascular: Negative for chest pain, palpitations and leg swelling.  Gastrointestinal: Negative for abdominal distention.  Genitourinary:  Negative for dysuria, urgency, frequency, hematuria, flank pain, decreased urine volume, difficulty urinating and dyspareunia.  Musculoskeletal: Negative for back pain, joint swelling, arthralgias and gait problem.  Neurological: Negative for dizziness, tremors, seizures, syncope, facial asymmetry, speech difficulty, weakness, light-headedness, numbness and headaches.  Hematological: Negative for adenopathy. Does not bruise/bleed easily.  Psychiatric/Behavioral: Negative for hallucinations, behavioral problems, confusion, dysphoric mood, decreased concentration and agitation.    Objective:   Filed Vitals:   08/02/13 1327  BP: 107/71  Pulse: 62  Temp: 98.6 F (37 C)  Resp: 16    Physical Exam  Constitutional: Appears well-developed and well-nourished. No distress.  HENT: Normocephalic. External right and left ear normal. Oropharynx is clear and moist.  Eyes: Conjunctivae and EOM are normal. PERRLA, no scleral icterus.  Neck: Normal ROM. Neck supple. No JVD. No tracheal deviation. No thyromegaly.  CVS: RRR, S1/S2 +, no murmurs, no gallops, no carotid bruit.  Pulmonary: Effort and breath sounds normal, no stridor, rhonchi, wheezes, rales.  Abdominal: Soft. BS +,  no distension, tenderness, rebound or guarding.  Musculoskeletal: Normal range of motion. No edema and no tenderness.  Lymphadenopathy: No lymphadenopathy noted, cervical, inguinal. Neuro: Alert. Normal reflexes, muscle tone coordination. No cranial nerve deficit. Skin: Skin is warm and dry. No rash noted. Not diaphoretic. No erythema. No pallor.  Psychiatric: Normal mood and affect. Behavior, judgment, thought content normal.   Lab Results  Component Value Date   WBC 9.0 08/03/2009   HGB 10.9* 08/03/2009   HCT 31.0* 08/03/2009  MCV 98.3 08/03/2009   PLT 92* 08/03/2009   Lab Results  Component Value Date   CREATININE 0.72 07/20/2008   BUN 14 07/20/2008   NA 141 07/20/2008   K 3.8 07/20/2008   CL 107 07/20/2008   CO2 23  07/20/2008   No results found for this basename: HGBA1C   Lipid Panel     Component Value Date/Time   CHOL 129 07/20/2008 2128   TRIG 99 07/20/2008 2128   HDL 47 07/20/2008 2128   CHOLHDL 2.7 Ratio 07/20/2008 2128   VLDL 20 07/20/2008 2128   LDLCALC 62 07/20/2008 2128     Assessment and plan:   Patient Active Problem List   Diagnosis Date Noted  . History of fibrocystic disease of breast 08/02/2013  . Screening for breast cancer 08/02/2013  . THROMBOCYTOPENIA 02/23/2011  . ABORTION, SPONTANEOUS 06/27/2010  . EXCESSIVE MENSTRUAL BLEEDING 06/16/2010  . FOOT PAIN, BILATERAL 07/20/2008  . NEUROGENIC BLADDER 02/02/2008  . URINARY INCONTINENCE 09/15/2007  . DEPRESSION, MAJOR, HX OF 09/15/2007  . ALLERGIC RHINITIS 04/22/2007  . COMPLETE SPONTANEOUS AB WITHOUT MENTION COMP 03/28/2006  . CHLAMYDIAL INFECTION 05/22/2005  . TRANSVERSE MYELITIS 09/27/2004   TRANSVERSE MYELITIS - Plan: Ambulatory referral to Neurology  NEUROGENIC BLADDER  Screening for breast cancer - Plan: MM Digital Screening  History of fibrocystic disease of breast  RTC in 3 months  Rx for gabapentin at her former dose has been prescribed.  She takes 300 mg po TID  The patient was given clear instructions to go to ER or return to medical center if symptoms don't improve, worsen or new problems develop.  The patient verbalized understanding.  The patient was told to call to get any lab results if not heard anything in the next week.    Rodney Langton, MD, CDE, FAAFP [facility] [place], [place]

## 2013-08-02 NOTE — Patient Instructions (Addendum)
Mielitis transversa (Transverse Myelitis) La mielitis transversa es un trastorno de la mdula espinal. La inflamacin de la mdula espinal puede lesionar la sustancia grasosa (mielina) que cubre las fibras nerviosas. Sera similar a perder el aislamiento de cables elctricos. Este dao causa problemas en las clulas nerviosas (neuronas) de la mdula espinal que transportan las seales del cerebro hacia el resto del cuerpo. La parte involucrada de la mdula espinal determinar qu partes del cuerpo se vern afectadas. Una lesin en la mdula espinal a la altura del pecho, puede causar problemas en el movimiento de las piernas y el control de los intestinos y la vejiga. Una lesin en la mdula espinal en el nivel del cuello puede ocasionar trastornos en la sensacin y las funciones motrices de los brazos, las piernas y el control de los intestinos y la vejiga. Si la lesin se produce en el cuello y es lo suficientemente alta, se puede incluso afectar la respiracin. Algunos pacientes se recuperan de este trastorno con secuelas menores o no presentan secuelas. Otros pueden tener problemas permanentes que pueden perjudicar las actividades cotidianas. La mayor parte de los pacientes slo tendrn un episodio de mielitis transversa. Un pequeo porcentaje puede sufrir el problema nuevamente. CAUSAS La causa exacta es desconocida. Algunos de los factores que se cree que pueden causar este trastorno incluyen:  Infecciones virales, tales como herpes, varicela (varicela zoster), citomegalovirus, y el virus de Epstein-Barr.  Reacciones inmunolgicas anormales.  Mal flujo sanguneo hacia los vasos de la mdula espinal. El trastorno tambin puede ocurrir como una complicacin de:  La sfilis  El sarampin  La enfermedad de Lyme  La varicela (cuando el caso de varicela es grave)  Algunas vacunas, entre las que se incluyen las de la varicela y la rabia [place] Los sntomas pueden durar desde varias horas a  Retail buyer. La mielitis transversa puede provocar una prdida de la funcin de la mdula espinal durante varias horas a varias semanas. Algunos de los sntomas son:  Dolor de [place].  Grant Ruts.  Prdida del apetito.  Un ataque repentino de dolor en la parte inferior de la espalda.  Sensaciones anormales en los pies y los dedos de los pies.  Debilidad en brazos y piernas.  Problemas urinarios y del intestino. DIAGNSTICO  Historia mdica y examen neurolgico.  Imgenes por resonancia magntica (IRM). Este procedimiento produce una imagen del cerebro y la mdula espinal.  Mielografa. En este procedimiento se utiliza una inyeccin de tintura y se toman radiografas.  Puede que sea necesario realizar anlisis de [place].  Es posible que se deba realizar una puncin espinal para Games developer lquido espinal. TRATAMIENTO Actualmente no existe una cura efectiva para este trastorno. Pero existen tratamientos diseados para ayudar a disminuir los sntomas.  El tratamiento con corticosteroides a menudo se Geneticist, molecular las primeras semanas de la enfermedad. Esto reduce la inflamacin.  Se indicarn medicamentos para el dolor si son necesarios.  En ocasiones se deben utilizar respiradores si existe una dificultad grave para respirar.  Puede realizarse terapia fsica para mantener la flexibilidad y la fuerza de los msculos.  El movimiento tambin disminuye las probabilidades de que se desarrollen lceras por presin.  Ms adelante, si el paciente comienza a recuperar el control de las extremidades, se comienza una terapia fsica para ayudar a mejorar la fuerza, coordinacin y la amplitud del movimiento muscular. INSTRUCCIONES PARA EL CUIDADO DOMICILIARIO Se pueden hacer muchas cosas para ayudar a personas con discapacidades permanentes.  Es probable que en su comunidad existan programas de  tratamiento y otros recursos. Podr solicitar esta informacin a asistentes sociales mdicos.  La  terapia de rehabilitacin le ensear cmo cuidarse a s mismo. La rehabilitacin no podr revertir el dao fsico que usted haya sufrido por esta enfermedad. Pero puede ayudar a las personas, e incluso a aquellas con parlisis graves, a ser lo ms independiente posible. Esto lo ayudar a Chief of Staff calidad de vida.  El profesional podr prescribirle medicamentos. Estos medicamentos podrn ayudarlo con la depresin y a adaptarse a este nuevo estilo de vida. Adems, existe una amplia variedad de medicamentos que pueden aliviar el dolor que producen las lesiones en la mdula espinal.  Es posible aumentar su fuerza y resistencia. El tratamiento mejora la coordinacin y reduce la espasticidad y la prdida muscular de extremidades paralizadas. Los fisioterapeutas y terapeutas fsicos tambin podrn ayudarlo a Designer, jewellery un mayor control de su vejiga y funcionamiento intestinal mediante distintos ejercicios. Tambin pueden ensear a pacientes paralizados diversas tcnicas para [employer] de ayuda de la manera ms efectiva posible. Entre estos dispositivos se incluyen sillas de ruedas, bastones o dispositivos ortopdicos.  Es importante aprender nuevas formas de Education officer, environmental tareas diarias. Estas tareas incluyen baarse, vestirse, preparar comida, limpiar el hogar, ocuparse en artes y oficios, o en jardinera. Los terapeutas ocupacionales podrn ayudarlo con esto. PARA MS [facility] [employer] of Neurological Disorders and Stroke: [place].co.uk Transverse Myelitis Association: www.myelitis.org American Chronic Pain Association: www.theacpa.org [place] Project to Cure Paralysis: www.themiamiproject.[facility] Information Center: https://www.olsen-oconnell.com/ Document Released: 04/01/2009 Document Revised: 03/07/2012 [facility] Patient Information 2014 [place], [place].

## 2013-08-04 ENCOUNTER — Ambulatory Visit: Payer: No Typology Code available for payment source

## 2013-12-11 ENCOUNTER — Ambulatory Visit: Payer: No Typology Code available for payment source | Attending: Internal Medicine

## 2013-12-14 ENCOUNTER — Encounter: Payer: Self-pay | Admitting: Internal Medicine

## 2013-12-14 ENCOUNTER — Other Ambulatory Visit (HOSPITAL_COMMUNITY)
Admission: RE | Admit: 2013-12-14 | Discharge: 2013-12-14 | Disposition: A | Discharge status: Home / Self Care | Payer: No Typology Code available for payment source | Source: Ambulatory Visit | Attending: Internal Medicine | Admitting: Internal Medicine

## 2013-12-14 ENCOUNTER — Ambulatory Visit: Payer: No Typology Code available for payment source | Attending: Internal Medicine | Admitting: Internal Medicine

## 2013-12-14 VITALS — BP 96/68 | HR 62 | Temp 99.3°F | Resp 16 | Ht 62.0 in | Wt 159.0 lb

## 2013-12-14 DIAGNOSIS — Z87898 Personal history of other specified conditions: Secondary | ICD-10-CM

## 2013-12-14 DIAGNOSIS — Z113 Encounter for screening for infections with a predominantly sexual mode of transmission: Secondary | ICD-10-CM | POA: Insufficient documentation

## 2013-12-14 DIAGNOSIS — N760 Acute vaginitis: Secondary | ICD-10-CM | POA: Insufficient documentation

## 2013-12-14 DIAGNOSIS — Z1151 Encounter for screening for human papillomavirus (HPV): Secondary | ICD-10-CM | POA: Insufficient documentation

## 2013-12-14 DIAGNOSIS — Z1239 Encounter for other screening for malignant neoplasm of breast: Secondary | ICD-10-CM

## 2013-12-14 DIAGNOSIS — Z01419 Encounter for gynecological examination (general) (routine) without abnormal findings: Secondary | ICD-10-CM | POA: Insufficient documentation

## 2013-12-14 DIAGNOSIS — Z124 Encounter for screening for malignant neoplasm of cervix: Secondary | ICD-10-CM

## 2013-12-14 DIAGNOSIS — Z0000 Encounter for general adult medical examination without abnormal findings: Secondary | ICD-10-CM

## 2013-12-14 LAB — CMP AND LIVER
AST: 18 U/L (ref 0–37)
Albumin: 4.2 g/dL (ref 3.5–5.2)
Alkaline Phosphatase: 65 U/L (ref 39–117)
BUN: 12 mg/dL (ref 6–23)
Calcium: 8.9 mg/dL (ref 8.4–10.5)
Chloride: 103 mEq/L (ref 96–112)
Potassium: 3.9 mEq/L (ref 3.5–5.3)
Sodium: 138 mEq/L (ref 135–145)
Total Protein: 7.3 g/dL (ref 6.0–8.3)

## 2013-12-14 LAB — CBC WITH DIFFERENTIAL/PLATELET
Basophils Absolute: 0 10*3/uL (ref 0.0–0.1)
Basophils Relative: 0 % (ref 0–1)
HCT: 39 % (ref 36.0–46.0)
Hemoglobin: 12.9 g/dL (ref 12.0–15.0)
Lymphocytes Relative: 28 % (ref 12–46)
Monocytes Absolute: 0.6 10*3/uL (ref 0.1–1.0)
Monocytes Relative: 7 % (ref 3–12)
Neutro Abs: 5.3 10*3/uL (ref 1.7–7.7)
Neutrophils Relative %: 64 % (ref 43–77)
RDW: 15.5 % (ref 11.5–15.5)
WBC: 8.4 10*3/uL (ref 4.0–10.5)

## 2013-12-14 LAB — LIPID PANEL
Cholesterol: 129 mg/dL (ref 0–200)
Triglycerides: 93 mg/dL (ref <150)
VLDL: 19 mg/dL (ref 0–40)

## 2013-12-14 NOTE — Patient Instructions (Signed)
Prueba de Papanicolaou  (Pap Test)  La prueba de Papanicolaou es un procedimiento realizado en una clnica para evaluar las clulas que estn en la superficie del cuello uterino. El cuello uterino se encuentra entre la parte inferior del tero y la parte superior de la vagina. En algunas mujeres, la regin del cuello del tero tiene el potencial de formar clulas cancerosas. Con evaluaciones constantes por su mdico, este tipo de cncer se puede prevenir.  Si una prueba de Papanicolaou es anormal, es generalmente el resultado de una exposicin previa al virus del papiloma humano (VPH). El VPH es un virus que puede infectar las clulas del cuello uterino y la displasia causa. La displasia es cuando las clulas no se ven normales. Si una mujer ha sido diagnosticada con displasia de alto grado o severa, est en mayor riesgo de desarrollar cncer cervical. Las personas diagnosticadas con displasia de bajo grado deben controlarse con su mdico porque hay una pequea probabilidad de que displasias de bajo grado puedan convertirse en cncer.  INFORME A SU MDICO SOBRE:   Si ha tenido una reciente infeccin de transmisin sexual (ITS).  Las nuevas parejas sexuales que ha tenido.  La historia de resultados de las anteriores pruebas de Papanicolaou anormales.  La historia de los procedimientos cervicales anteriores que haya tenido (colposcopia, biopsia, procedimiento de excisin electroquirrgica [LEEP]).  Las preocupaciones que haya tenido respecto a secreciones vaginales inusuales.  Antecedentes de dolor plvico.  El uso de mtodos anticonceptivos. ANTES DEL PROCEDIMIENTO   Pregntele a su mdico cundo programar la prueba. Es mejor hacerla cuando no est en su perodo si el mdico usa una esptula de madera para recoger clulas o coloca las clulas en un portaobjetos de vidrio. Las tcnicas no son tan sensibles durante el ciclo menstrual.  No use duchas vaginales ni tenga relaciones sexuales durante  las 24 horas anteriores al procedimiento.   No use cremas vaginales o tampones durante las 24 horas antes de la prueba.   Vace su vejiga justo antes del procedimiento para disminuir las molestias.  PROCEDIMIENTO  Deber acostarse en una camilla con los pies en los estribos. Se colocar un instrumento tibio de metal o plstico (espculo) en la vagina. Este instrumento permite al mdico ver el interior de la vagina y observar el cuello del tero. Luego se usa un cepillo de plstico pequeo, o una esptula de madera para recoger las clulas. Esas clulas se colocan en un recipiente para ser examinadas en el laboratorio. Las clulas se estudian en el microscopio. Un especialista determinar si las clulas son normales.  DESPUS DEL PROCEDIMIENTO  Asegrese de obtener los resultados.Si los resultados son anormales, puede ser necesario que se haga nuevos estudios.  Document Released: 06/01/2008 Document Revised: 03/07/2012 [employer] Patient Information 2014 [employer].  

## 2013-12-14 NOTE — Progress Notes (Signed)
Pt is here for a f/u visit. Pt reports that she is having frequent pain in her left breast.

## 2013-12-14 NOTE — Progress Notes (Signed)
Patient ID: [name], female   DOB: May 29, 1971, 42 y.o.   MRN: [id number] Patient Demographics  [name], is a 42 y.o. female  HQI:[id number]  XLK:[id number]  DOB - 12/21/1971  Chief Complaint  Patient presents with  . Follow-up        Subjective:   [name] is a 42 y.o. female here today for a follow up visit. Patient has no significant past medical history except for history of fibrocystic disease of the breast. She is here today for Pap smear and annual physical examination. She has no complaints. She's not on any medication. She claims to be doing well at home. She does not smoke cigarette she does not drink alcohol. Patient has No headache, No chest pain, No abdominal pain - No Nausea, No new weakness tingling or numbness, No Cough - SOB.  ALLERGIES: No Known Allergies  PAST MEDICAL HISTORY: Past Medical History  Diagnosis Date  . Transverse myelitis     MEDICATIONS AT HOME: Prior to Admission medications   Medication Sig Start Date End Date Taking? Authorizing Provider  gabapentin (NEURONTIN) 300 MG capsule Take 1 capsule (300 mg total) by mouth 3 (three) times daily. 08/02/13  Yes [place] Cyndie Mull, MD     Objective:   Filed Vitals:   12/14/13 1136  BP: 96/68  Pulse: 62  Temp: 99.3 F (37.4 C)  TempSrc: Oral  Resp: 16  Height: 5\' 2"  (1.575 m)  Weight: 159 lb (72.122 kg)  SpO2: 98%    Exam General appearance : Awake, alert, not in any distress. Speech Clear. Not toxic looking HEENT: Atraumatic and Normocephalic, pupils equally reactive to light and accomodation Neck: supple, no JVD. No cervical lymphadenopathy.  Chest:Good air entry bilaterally, no added sounds  CVS: S1 S2 regular, no murmurs.  Abdomen: Bowel sounds present, Non tender and not distended with no gaurding, rigidity or rebound. Extremities: B/L Lower Ext shows no edema, both legs are warm to touch Neurology: Awake alert, and oriented X 3, CN II-XII intact, Non  focal Skin:No Rash Wounds:N/A   Data Review   CBC No results found for this basename: WBC, HGB, HCT, PLT, MCV, MCH, MCHC, RDW, NEUTRABS, LYMPHSABS, MONOABS, EOSABS, BASOSABS, BANDABS, BANDSABD,  in the last 168 hours  Chemistries   No results found for this basename: NA, K, CL, CO2, GLUCOSE, BUN, CREATININE, GFRCGP, CALCIUM, MG, AST, ALT, ALKPHOS, BILITOT,  in the last 168 hours ------------------------------------------------------------------------------------------------------------------ No results found for this basename: HGBA1C,  in the last 72 hours ------------------------------------------------------------------------------------------------------------------ No results found for this basename: CHOL, HDL, LDLCALC, TRIG, CHOLHDL, LDLDIRECT,  in the last 72 hours ------------------------------------------------------------------------------------------------------------------ No results found for this basename: TSH, T4TOTAL, FREET3, T3FREE, THYROIDAB,  in the last 72 hours ------------------------------------------------------------------------------------------------------------------ No results found for this basename: VITAMINB12, FOLATE, FERRITIN, TIBC, IRON, RETICCTPCT,  in the last 72 hours  Coagulation profile  No results found for this basename: INR, PROTIME,  in the last 168 hours    Assessment & Plan   1. Screening for breast cancer Mammogram ordered  2. Pap smear for cervical cancer screening  - Cytology - PAP done, sent for cytology - Cervicovaginal ancillary only for GC/Chlamydia and wet mount  3. History of fibrocystic disease of breast  - MM Digital Screening; Future  4. Annual physical exam  - CBC with Differential - CMP and Liver - Urinalysis, Complete - Lipid panel - TSH Patient extensively counseled about nutrition and exercise Follow up in 3 months or when necessary   Interpreter was  used to communicate directly with patient for the  entire encounter including providing detailed patient instructions.   The patient was given clear instructions to go to ER or return to medical center if symptoms don't improve, worsen or new problems develop. The patient verbalized understanding. The patient was told to call to get lab results if they haven't heard anything in the next week.    Jeanann Lewandowsky, MD, MHA, FACP, FAAP [facility] [place], [place] [phone]   12/14/2013, 12:02 PM

## 2013-12-15 LAB — URINALYSIS, COMPLETE
Bilirubin Urine: NEGATIVE
Casts: NONE SEEN
Glucose, UA: NEGATIVE mg/dL
Ketones, ur: NEGATIVE mg/dL
Protein, ur: NEGATIVE mg/dL
pH: 5.5 (ref 5.0–8.0)

## 2013-12-18 ENCOUNTER — Telehealth: Payer: Self-pay | Admitting: *Deleted

## 2013-12-18 NOTE — Telephone Encounter (Signed)
1st attempt left message. 

## 2013-12-18 NOTE — Telephone Encounter (Signed)
Message copied by Raynelle Chary on Mon Dec 18, 2013  4:59 PM ------      Message from: Quentin Angst      Created: Fri Dec 15, 2013  2:48 PM       Please inform patient that her laboratory tests results are within normal limits including her cholesterol level and thyroid function ------

## 2013-12-18 NOTE — Telephone Encounter (Signed)
Message copied by Raynelle Chary on Mon Dec 18, 2013 11:21 AM ------      Message from: Quentin Angst      Created: Fri Dec 15, 2013  2:48 PM       Please inform patient that her laboratory tests results are within normal limits including her cholesterol level and thyroid function ------

## 2013-12-18 NOTE — Telephone Encounter (Signed)
Pt called me and I informed her that her labs were normal.

## 2014-01-26 ENCOUNTER — Ambulatory Visit: Payer: No Typology Code available for payment source

## 2014-02-14 ENCOUNTER — Ambulatory Visit: Payer: Self-pay

## 2014-03-05 ENCOUNTER — Ambulatory Visit: Payer: No Typology Code available for payment source | Attending: Internal Medicine

## 2014-03-08 ENCOUNTER — Encounter (HOSPITAL_COMMUNITY): Payer: Self-pay

## 2014-03-08 ENCOUNTER — Emergency Department (HOSPITAL_COMMUNITY)
Admission: EM | Admit: 2014-03-08 | Discharge: 2014-03-08 | Disposition: A | Discharge status: Home / Self Care | Payer: No Typology Code available for payment source | Source: Home / Self Care | Attending: Family Medicine | Admitting: Family Medicine

## 2014-03-08 ENCOUNTER — Encounter (HOSPITAL_COMMUNITY): Payer: Self-pay | Admitting: Emergency Medicine

## 2014-03-08 ENCOUNTER — Inpatient Hospital Stay (HOSPITAL_COMMUNITY): Payer: No Typology Code available for payment source

## 2014-03-08 ENCOUNTER — Inpatient Hospital Stay (HOSPITAL_COMMUNITY)
Admission: AD | Admit: 2014-03-08 | Discharge: 2014-03-08 | Disposition: A | Discharge status: Home / Self Care | Payer: No Typology Code available for payment source | Source: Ambulatory Visit | Attending: Obstetrics & Gynecology | Admitting: Obstetrics & Gynecology

## 2014-03-08 DIAGNOSIS — N939 Abnormal uterine and vaginal bleeding, unspecified: Secondary | ICD-10-CM

## 2014-03-08 DIAGNOSIS — Z331 Pregnant state, incidental: Secondary | ICD-10-CM

## 2014-03-08 DIAGNOSIS — Z3201 Encounter for pregnancy test, result positive: Secondary | ICD-10-CM

## 2014-03-08 DIAGNOSIS — Z3490 Encounter for supervision of normal pregnancy, unspecified, unspecified trimester: Secondary | ICD-10-CM

## 2014-03-08 DIAGNOSIS — O039 Complete or unspecified spontaneous abortion without complication: Secondary | ICD-10-CM | POA: Insufficient documentation

## 2014-03-08 DIAGNOSIS — N898 Other specified noninflammatory disorders of vagina: Secondary | ICD-10-CM

## 2014-03-08 LAB — ABO/RH: ABO/RH(D): O POS

## 2014-03-08 LAB — HCG, QUANTITATIVE, PREGNANCY: hCG, Beta Chain, Quant, S: 1168 m[IU]/mL — ABNORMAL HIGH (ref <5)

## 2014-03-08 LAB — CBC
HCT: 36.1 % (ref 36.0–46.0)
Hemoglobin: 12.4 g/dL (ref 12.0–15.0)
MCH: 30.9 pg (ref 26.0–34.0)
MCHC: 34.3 g/dL (ref 30.0–36.0)
MCV: 90 fL (ref 78.0–100.0)
Platelets: 153 10*3/uL (ref 150–400)
RBC: 4.01 MIL/uL (ref 3.87–5.11)
RDW: 13.4 % (ref 11.5–15.5)
WBC: 8.8 10*3/uL (ref 4.0–10.5)

## 2014-03-08 LAB — POCT PREGNANCY, URINE: Preg Test, Ur: POSITIVE — AB

## 2014-03-08 NOTE — ED Notes (Signed)
C/o heavy menstrual cycle States cycle started on 03/03/14  States she is having heavy clots with heavy flow

## 2014-03-08 NOTE — ED Provider Notes (Signed)
CSN: [id number]     Arrival date & time 03/08/14  1534 History   First MD Initiated Contact with Patient 03/08/14 1547     Chief Complaint  Patient presents with  . Menorrhagia   (Consider location/radiation/quality/duration/timing/severity/associated sxs/prior Treatment) Patient is a 43 y.o. female presenting with vaginal bleeding. The history is provided by the patient. The history is limited by a language barrier. A language interpreter was used.  Vaginal Bleeding Quality:  Heavier than menses Severity:  Moderate Onset quality:  Gradual Duration:  1 day Timing:  Constant Progression:  Unchanged Chronicity:  New Menstrual history:  Regular (Reports LNMP was Jan. 2015 and had brief, lighter flow menses in Feb. 2015) Possible pregnancy: no   Relieved by:  Nothing Worsened by:  Activity Associated symptoms: no abdominal pain, no back pain, no dizziness, no dysuria, no fatigue, no fever, no nausea and no vaginal discharge   Risk factors: prior miscarriage   Risk factors: no bleeding disorder     Past Medical History  Diagnosis Date  . Transverse myelitis    History reviewed. No pertinent past surgical history. History reviewed. No pertinent family history. History  Substance Use Topics  . Smoking status: Never Smoker   . Smokeless tobacco: Not on file  . Alcohol Use: No   OB History   Grav Para Term Preterm Abortions TAB SAB Ect Mult Living                 Review of Systems  Constitutional: Negative for fever and fatigue.  HENT: Negative.   Eyes: Negative.   Respiratory: Negative.   Cardiovascular: Negative.   Gastrointestinal: Negative for nausea, vomiting and abdominal pain.  Genitourinary: Positive for vaginal bleeding and menstrual problem. Negative for dysuria, vaginal discharge, difficulty urinating, vaginal pain and pelvic pain.  Musculoskeletal: Negative for back pain.  Skin: Negative.   Neurological: Negative for dizziness, weakness, light-headedness and  headaches.    Allergies  Review of patient's allergies indicates no known allergies.  Home Medications   Current Outpatient Rx  Name  Route  Sig  Dispense  Refill  . gabapentin (NEURONTIN) 300 MG capsule   Oral   Take 1 capsule (300 mg total) by mouth 3 (three) times daily.   90 capsule   2    BP 119/84  Pulse 68  Temp(Src) 98.1 F (36.7 C) (Oral)  Resp 18  SpO2 99%  LMP 03/03/2014 Physical Exam  Nursing note and vitals reviewed. Constitutional: She is oriented to person, place, and time. She appears well-developed and well-nourished. No distress.  HENT:  Head: Normocephalic and atraumatic.  Eyes: Conjunctivae are normal. No scleral icterus.  Cardiovascular: Normal rate, regular rhythm and normal heart sounds.   Pulmonary/Chest: Effort normal and breath sounds normal.  Abdominal: Soft. Bowel sounds are normal. She exhibits no distension and no mass. There is no tenderness. There is no rebound and no guarding.  Musculoskeletal: Normal range of motion.  Neurological: She is alert and oriented to person, place, and time.  Skin: Skin is warm and dry. No rash noted.  Psychiatric: She has a normal mood and affect. Her behavior is normal.    ED Course  Procedures (including critical care time) Labs Review Labs Reviewed  POCT PREGNANCY, URINE - Abnormal; Notable for the following:    Preg Test, Ur POSITIVE (*)    All other components within normal limits   Imaging Review No results found.   MDM   1. Vaginal bleeding   2. Pregnant  Spontaneous/threatened abortion: UPT positive. Provided patient with lab results and instructed [facility]. Vital signs stable. Patient denies abdominal pain, weakness, fatigue or dizziness. Declines offer of our transportation services. States she is completely comfortable driving her own [facility]. States she is familiar with where hospital is located.    Jess BartersJennifer Lee DacomaPresson, [place] 03/08/14 [phone]

## 2014-03-08 NOTE — Discharge Instructions (Signed)
Aborto espontáneo  °(Miscarriage) °El aborto espontáneo es la pérdida de un bebé que no ha nacido (feto) antes de la semana 20 del embarazo. La mayor parte de estos abortos ocurre en los primeros 3 meses. En algunos casos ocurre antes de que la mujer sepa que está embarazada. También se denomina "aborto espontáneo" o "pérdida prematura del embarazo". El aborto espontáneo puede ser una experiencia que afecte emocionalmente a la persona. Converse con su médico si tiene dudas, cómo es el proceso de duelo, y sobre planes futuros de embarazo.  °CAUSAS  °· Algunos problemas cromosómicos pueden hacer imposible que el bebé se desarrolle normalmente. Los problemas con los genes o cromosomas del bebé son generalmente el resultado de errores que se producen, por casualidad, cuando el embrión se divide y crece. Estos problemas no se heredan de los padres. °· Infección en el cuello del útero.   °· Problemas hormonales.   °· Problemas en el cuello del útero, como tener un útero incompetente. Esto ocurre cuando los tejidos no son lo suficientemente fuertes como para contener el embarazo.   °· Problemas del útero, como un útero con forma anormal, los fibromas o anormalidades congénitas.   °· Ciertas enfermedades crónicas.   °· No fume, no beba alcohol, ni consuma drogas.   °· Traumatismos   °A veces, la causa es desconocida.  °SÍNTOMAS  °· Sangrado o manchado vaginal, con o sin cólicos o dolor. °· Dolor o cólicos en el abdomen o en la cintura. °· Eliminación de líquido, tejidos o coágulos grandes por la vagina. °DIAGNÓSTICO  °El médico le hará un examen físico. También le indicará una ecografía para confirmar el aborto. Es posible que se realicen análisis de sangre.  °TRATAMIENTO  °· En algunos casos el tratamiento no es necesario, si se eliminan naturalmente todos los tejidos embrionarios que se encontraban en el útero. Si el feto o la placenta quedan dentro del útero (aborto incompleto), pueden infectarse, los tejidos que quedan  pueden infectarse y deben retirarse. Generalmente se realiza un procedimiento de dilatación y curetaje (D y C). Durante el procedimiento de dilatación y curetaje, el cuello del útero se abre (dilata) y se retira cualquier resto de tejido fetal o placentario del útero. °· Si hay una infección, le recetarán antibióticos. Podrán recetarle otros medicamentos para reducir el tamaño del útero (contraerlo) si hay una mucho sangrado. °· Si su sangre es Rh negativa y su bebé es Rh positivo, usted necesitará la inyección de inmunoglobulina Rh. Esta inyección protegerá a los futuros bebés de tener problemas de compatibilidad Rh en futuros embarazos. °INSTRUCCIONES PARA EL CUIDADO EN EL HOGAR  °· El médico le indicará reposo en cama o le permitirá realizar actividades livianas. Vuelva a la actividad lentamente o según las indicaciones de su médico. °· Pídale a alguien que la ayude con las responsabilidades familiares y del hogar durante este tiempo.   °· Lleve un registro de la cantidad y la saturación de las toallas higiénicas que utiliza cada día. Anote esta información   °· No use tampones. No No se haga duchas vaginales ni tenga relaciones sexuales hasta que el médico la autorice.   °· Sólo tome medicamentos de venta libre o recetados para calmar el dolor o el malestar, según las indicaciones de su médico.   °· No tome aspirina. La aspirina puede ocasionar hemorragias.   °· Concurra puntualmente a las citas de control con el médico.   °· Si usted o su pareja tienen dificultades con el duelo, hable con su médico para buscar la ayuda psicológica que los ayude a enfrentar la pérdida   del embarazo. Permítase el tiempo suficiente de duelo antes de quedar embarazada nuevamente.   °SOLICITE ATENCIÓN MÉDICA DE INMEDIATO SI:  °· Siente calambres intensos o dolor en la espalda o en el abdomen. °· Tiene fiebre. °· Elimina grandes coágulos de sangre (del tamaño de una nuez o más) o tejidos por la vagina. Guarde lo que ha eliminado para  que su médico lo examine.   °· La hemorragia aumenta.   °· Observa una secreción vaginal espesa y con mal olor. °· Se siente mareada, débil, o se desmaya.   °· Siente escalofríos.   °ASEGÚRESE DE QUE:  °· Comprende estas instrucciones. °· Controlará su enfermedad. °· Solicitará ayuda de inmediato si no mejora o si empeora. °Document Released: 09/23/2005 Document Revised: 04/10/2013 °ExitCare® Patient Information ©2014 [employer]. ° °

## 2014-03-08 NOTE — MAU Note (Signed)
[facility] for possible pregnancy and vaginal bleeding with a little pain.

## 2014-03-08 NOTE — Progress Notes (Signed)
Pt states she had a headache earlier today but not now

## 2014-03-08 NOTE — MAU Provider Note (Signed)
History     CSN: [id number]  Arrival date and time: 03/08/14 1720   None     Chief Complaint  Patient presents with  . Vaginal Bleeding   Vaginal Bleeding    [name] is a 43 y.o. [id number] at 3264w6d who presents today with vaginal bleeding. She states that she has been bleeding lightly for the last week, but that this morning it was very heavy with clots. She denies any pain at this time. She had a miscarriage last year, and is very worried that she is having another miscarriage. She went to UC, and a UPT was done that was positive. She was sent here for further evaluation.   Past Medical History  Diagnosis Date  . Transverse myelitis   . Medical history non-contributory     Past Surgical History  Procedure Laterality Date  . No past surgeries      Family History  Problem Relation Age of Onset  . Asthma Mother   . Cancer Mother   . Cancer Sister   . Cancer Maternal Aunt     History  Substance Use Topics  . Smoking status: Never Smoker   . Smokeless tobacco: Not on file  . Alcohol Use: No    Allergies: No Known Allergies  Prescriptions prior to admission  Medication Sig Dispense Refill  . gabapentin (NEURONTIN) 300 MG capsule Take 1 capsule (300 mg total) by mouth 3 (three) times daily.  90 capsule  2    Review of Systems  Genitourinary: Positive for vaginal bleeding.   Physical Exam   Blood pressure 118/58, pulse 63, temperature 99.2 F (37.3 C), temperature source Oral, resp. rate 16, height 5' 1.5" (1.562 m), weight 68.765 kg (151 lb 9.6 oz), last menstrual period 02/23/2014, SpO2 97.00%.  Physical Exam  Nursing note and vitals reviewed. Constitutional: She is oriented to person, place, and time. She appears well-developed and well-nourished. No distress.  Cardiovascular: Normal rate.   Respiratory: Effort normal.  GI: Soft. There is no tenderness.  Neurological: She is alert and oriented to person, place, and time.  Skin: Skin is warm and  dry.  Psychiatric: She has a normal mood and affect.    MAU Course  Procedures  Results for orders placed during the hospital encounter of 03/08/14 (from the past 24 hour(s))  ABO/RH     Status: None   Collection Time    03/08/14  5:45 PM      Result Value Ref Range   ABO/RH(D) O POS    CBC     Status: None   Collection Time    03/08/14  5:46 PM      Result Value Ref Range   WBC 8.8  4.0 - 10.5 K/uL   RBC 4.01  3.87 - 5.11 MIL/uL   Hemoglobin 12.4  12.0 - 15.0 g/dL   HCT 13.036.1  86.536.0 - 78.446.0 %   MCV 90.0  78.0 - 100.0 fL   MCH 30.9  26.0 - 34.0 pg   MCHC 34.3  30.0 - 36.0 g/dL   RDW 69.613.4  29.511.5 - 28.415.5 %   Platelets 153  150 - 400 K/uL  HCG, QUANTITATIVE, PREGNANCY     Status: Abnormal   Collection Time    03/08/14  5:46 PM      Result Value Ref Range   hCG, Beta Chain, Quant, S 1168 (*) <5 mIU/mL   [place] Ob Comp Less 14 Wks  03/08/2014   CLINICAL DATA:  Pregnant.  Vaginal bleeding.  Beta HCG level, 1,168.  EXAM: OBSTETRIC <14 WK [place] AND TRANSVAGINAL OB US  TECHNIQUE: Both transabdominal and transvaginal ultrasound examinations were performed for complete evaluation of the gestation as well as the maternal uterus, adnexal regions, and pelvic cul-de-sac. Transvaginal technique was performed to assess early pregnancy.  COMPARISON:  None.  FINDINGS: Intrauterine gestational sac: Elongated gestational sac lies in the lower uterine segment extending to the endocervix.  Yolk sac:  Elongated yolk sac lies within the gestational sac.  Embryo:  Small embryo evident in the gestational sac.  Cardiac Activity: Not visualized  Heart Rate:  Not detected  CRL: 2 mm mm 5 w 6 d [place] EDC: 11/02/2014 gestational sac size is concordant with the embryo size.  Maternal uterus/adnexae: There is a crescent-shaped area of hypo echogenicity along the margin of the gestational sac consistent with a moderate subchorionic hemorrhage.  No uterine masses.  Both ovaries are well seen and are unremarkable.  No pelvic free  fluid.  IMPRESSION: 1. Elongated gestational sac lies along uterine segment extending to the and the cervix. Contains a yolk sac and an embryo. No fetal heart activity could be detected from the embryo. Given the lack of the the heart activity in the position in configuration of the gestational sac, a miscarriage in progress is suspected. Short-term sonographic follow-up to re-evaluate for completed miscarriage is recommended. 2. Moderate size subchorionic hemorrhage. 3. Normal ovaries and adnexa.   Electronically Signed   By: Amie Portland M.D.   On: 03/08/2014 20:47   US Ob Transvaginal  03/08/2014   CLINICAL DATA:  Pregnant.  Vaginal bleeding.  Beta HCG level, 1,168.  EXAM: OBSTETRIC <14 WK [place] AND TRANSVAGINAL OB US  TECHNIQUE: Both transabdominal and transvaginal ultrasound examinations were performed for complete evaluation of the gestation as well as the maternal uterus, adnexal regions, and pelvic cul-de-sac. Transvaginal technique was performed to assess early pregnancy.  COMPARISON:  None.  FINDINGS: Intrauterine gestational sac: Elongated gestational sac lies in the lower uterine segment extending to the endocervix.  Yolk sac:  Elongated yolk sac lies within the gestational sac.  Embryo:  Small embryo evident in the gestational sac.  Cardiac Activity: Not visualized  Heart Rate:  Not detected  CRL: 2 mm mm 5 w 6 d [place] EDC: 11/02/2014 gestational sac size is concordant with the embryo size.  Maternal uterus/adnexae: There is a crescent-shaped area of hypo echogenicity along the margin of the gestational sac consistent with a moderate subchorionic hemorrhage.  No uterine masses.  Both ovaries are well seen and are unremarkable.  No pelvic free fluid.  IMPRESSION: 1. Elongated gestational sac lies along uterine segment extending to the and the cervix. Contains a yolk sac and an embryo. No fetal heart activity could be detected from the embryo. Given the lack of the the heart activity in the position in  configuration of the gestational sac, a miscarriage in progress is suspected. Short-term sonographic follow-up to re-evaluate for completed miscarriage is recommended. 2. Moderate size subchorionic hemorrhage. 3. Normal ovaries and adnexa.   Electronically Signed   By: Amie Portland M.D.   On: 03/08/2014 20:47     Assessment and Plan   1. SAB (spontaneous abortion)    Bleeding precautions Return to MAU as needed  Follow-up Information   Follow up with [facility] In 2 weeks.   Specialty:  Obstetrics and Gynecology   Contact information:   [address] [place] [address] [phone]  Tawnya Crook 03/08/2014, 8:42 PM

## 2014-03-08 NOTE — MAU Note (Signed)
Pt states she was sent for urgent care because she found out there that she is pregnant and bleeding too much.

## 2014-03-09 NOTE — MAU Provider Note (Signed)
Attestation of Attending Supervision of Advanced Practitioner (PA/CNM/NP): Evaluation and management procedures were performed by the Advanced Practitioner under my supervision and collaboration.  I have reviewed the Advanced Practitioner's note and chart, and I agree with the management and plan.  Amayrany Cafaro, MD, FACOG Attending Obstetrician & Gynecologist Faculty Practice, [facility]  

## 2014-03-09 NOTE — ED Provider Notes (Signed)
Medical screening examination/treatment/procedure(s) were performed by a resident physician or non-physician practitioner and as the supervising physician I was immediately available for consultation/collaboration.  Carlo Lorson, MD    Floree Zuniga S Samyuktha Brau, MD 03/09/14 0737 

## 2014-03-14 ENCOUNTER — Ambulatory Visit: Payer: No Typology Code available for payment source | Attending: Internal Medicine | Admitting: Internal Medicine

## 2014-03-14 ENCOUNTER — Encounter: Payer: Self-pay | Admitting: Internal Medicine

## 2014-03-14 VITALS — BP 103/66 | HR 71 | Temp 98.5°F | Resp 16 | Wt 152.2 lb

## 2014-03-14 DIAGNOSIS — Z0000 Encounter for general adult medical examination without abnormal findings: Secondary | ICD-10-CM

## 2014-03-14 DIAGNOSIS — R350 Frequency of micturition: Secondary | ICD-10-CM | POA: Insufficient documentation

## 2014-03-14 LAB — POCT URINALYSIS DIPSTICK
Bilirubin, UA: NEGATIVE
Blood, UA: NEGATIVE
Glucose, UA: NEGATIVE
KETONES UA: NEGATIVE
LEUKOCYTES UA: NEGATIVE
Nitrite, UA: NEGATIVE
Protein, UA: NEGATIVE
Spec Grav, UA: 1.02
UROBILINOGEN UA: 0.2
pH, UA: 7.5

## 2014-03-14 LAB — POCT URINE PREGNANCY: PREG TEST UR: NEGATIVE

## 2014-03-14 NOTE — Progress Notes (Signed)
MRN: [id number] Name: [name]  Sex: female Age: 43 y.o. DOB: [birth year]  Allergies: Review of patient's allergies indicates no known allergies.  Chief Complaint  Patient presents with  . Urinary Frequency    HPI: Patient is 43 y.o. female who comes today reported to have urinary frequency, denies any dysuria nausea vomiting, last week she had a spontaneous [facility], today her urinary pregnancy test is negative, she wanted to discuss about birth control and possible tubal ligation, patient has 3 children and already had 3 spontaneous abortions, she does not want any more children.  Past Medical History  Diagnosis Date  . Transverse myelitis   . Medical history non-contributory     Past Surgical History  Procedure Laterality Date  . No past surgeries        Medication List       This list is accurate as of: 03/14/14  3:07 PM.  Always use your most recent med list.               multivitamin with minerals Tabs tablet  Take 1 tablet by mouth daily.     naproxen 250 MG tablet  Commonly known as:  NAPROSYN  Take 500 mg by mouth 2 (two) times daily with a meal.        No orders of the defined types were placed in this encounter.    Immunization History  Administered Date(s) Administered  . Td 07/20/2008    Family History  Problem Relation Age of Onset  . Asthma Mother   . Cancer Mother   . Cancer Sister   . Cancer Maternal Aunt     History  Substance Use Topics  . Smoking status: Never Smoker   . Smokeless tobacco: Not on file  . Alcohol Use: No    Review of Systems   As noted in HPI  Filed Vitals:   03/14/14 1450  BP: 103/66  Pulse: 71  Temp: 98.5 F (36.9 C)  Resp: 16    Physical Exam  Physical Exam  Eyes: EOM are normal. Pupils are equal, round, and reactive to light.  Cardiovascular: Normal rate and regular rhythm.   Pulmonary/Chest: Breath sounds normal. No respiratory distress. She has  no wheezes. She has no rales.  Abdominal: Soft. Bowel sounds are normal. There is no tenderness.  No CVA tenderness    CBC    Component Value Date/Time   WBC 8.8 03/08/2014 1746   RBC 4.01 03/08/2014 1746   HGB 12.4 03/08/2014 1746   HCT 36.1 03/08/2014 1746   PLT 153 03/08/2014 1746   MCV 90.0 03/08/2014 1746   LYMPHSABS 2.4 12/14/2013 1221   MONOABS 0.6 12/14/2013 1221   EOSABS 0.1 12/14/2013 1221   BASOSABS 0.0 12/14/2013 1221    CMP     Component Value Date/Time   NA 138 12/14/2013 1221   K 3.9 12/14/2013 1221   CL 103 12/14/2013 1221   CO2 25 12/14/2013 1221   GLUCOSE 84 12/14/2013 1221   BUN 12 12/14/2013 1221   CREATININE 0.66 12/14/2013 1221   CREATININE 0.72 07/20/2008 2128   CALCIUM 8.9 12/14/2013 1221   PROT 7.3 12/14/2013 1221   ALBUMIN 4.2 12/14/2013 1221   AST 18 12/14/2013 1221   ALT 14 12/14/2013 1221   ALKPHOS 65 12/14/2013 1221   BILITOT 0.6 12/14/2013 1221    Lab Results  Component Value Date/Time   CHOL 129 12/14/2013 12:21 PM    No  components found with this basename: hga1c    Lab Results  Component Value Date/Time   AST 18 12/14/2013 12:21 PM    Assessment and Plan  Preventative health care - Plan: POCT urine pregnancy  Test is negative, patient will follow up with her gynecologist next week to discuss about birth control and for possible tubal ligation since patient does not want anymore children and had 3 abortions.  Frequent urination - Plan: Urinalysis Dipstick is negative for any infection.    Return if symptoms worsen or fail to improve.  Doris Cheadle, MD

## 2014-03-14 NOTE — Progress Notes (Signed)
Patient here with interpreter Recently had an abortion Would like to start on birth control Complains of frequent urination

## 2014-03-15 ENCOUNTER — Ambulatory Visit: Payer: Self-pay

## 2014-03-22 ENCOUNTER — Encounter: Payer: Self-pay | Admitting: *Deleted

## 2014-03-22 ENCOUNTER — Ambulatory Visit (INDEPENDENT_AMBULATORY_CARE_PROVIDER_SITE_OTHER): Payer: No Typology Code available for payment source | Admitting: Obstetrics & Gynecology

## 2014-03-22 ENCOUNTER — Encounter: Payer: Self-pay | Admitting: Obstetrics & Gynecology

## 2014-03-22 VITALS — BP 99/62 | HR 69 | Temp 97.8°F | Ht 60.0 in | Wt 152.3 lb

## 2014-03-22 DIAGNOSIS — O039 Complete or unspecified spontaneous abortion without complication: Secondary | ICD-10-CM

## 2014-03-22 NOTE — Progress Notes (Signed)
   CLINIC ENCOUNTER NOTE  History:  43 y.o. [id number] here today for follow up SAB; she passed the tissue after her visit on 03/08/14.  Patient is Spanish-speaking only, Spanish interpreter present for this encounter.She denies any bleeding or pain. Not sexually active, desires Depo Provera for contraception. No other GYN concerns.  The following portions of the patient's history were reviewed and updated as appropriate: allergies, current medications, past family history, past medical history, past social history, past surgical history and problem list.  Last pap smear was 12/14/13 was normal.  Review of Systems:  Pertinent items are noted in HPI.  Objective:  Physical Exam BP 99/62  Pulse 69  Temp(Src) 97.8 F (36.6 C) (Oral)  Ht 5' (1.524 m)  Wt 152 lb 4.8 oz (69.083 kg)  BMI 29.74 kg/m2  LMP 02/23/2014 Gen: NAD Abd: Soft, nontender and nondistended Pelvic:Deferred  Labs and Imaging 03/08/2014   CLINICAL DATA:  Pregnant.  Vaginal bleeding.  Beta HCG level, 1,168.  EXAM: OBSTETRIC <14 WK US AND TRANSVAGINAL OB US  TECHNIQUE: Both transabdominal and transvaginal ultrasound examinations were performed for complete evaluation of the gestation as well as the maternal uterus, adnexal regions, and pelvic cul-de-sac. Transvaginal technique was performed to assess early pregnancy.  COMPARISON:  None.  FINDINGS: Intrauterine gestational sac: Elongated gestational sac lies in the lower uterine segment extending to the endocervix.  Yolk sac:  Elongated yolk sac lies within the gestational sac.  Embryo:  Small embryo evident in the gestational sac.  Cardiac Activity: Not visualized  Heart Rate:  Not detected  CRL: 2 mm mm 5 w 6 d US EDC: 11/02/2014 gestational sac size is concordant with the embryo size.  Maternal uterus/adnexae: There is a crescent-shaped area of hypo echogenicity along the margin of the gestational sac consistent with a moderate subchorionic hemorrhage.  No uterine masses.  Both  ovaries are well seen and are unremarkable.  No pelvic free fluid.  IMPRESSION: 1. Elongated gestational sac lies along uterine segment extending to the and the cervix. Contains a yolk sac and an embryo. No fetal heart activity could be detected from the embryo. Given the lack of the the heart activity in the position in configuration of the gestational sac, a miscarriage in progress is suspected. Short-term sonographic follow-up to re-evaluate for completed miscarriage is recommended. 2. Moderate size subchorionic hemorrhage. 3. Normal ovaries and adnexa.   Electronically Signed   By: Amie Portlandavid  Ormond M.D.   On: 03/08/2014 20:47    Assessment & Plan:  [facility] and [facility] care    Jaynie CollinsUGONNA  ANYANWU, MD, FACOG Attending Obstetrician & Gynecologist Faculty Practice, [facility]

## 2014-03-22 NOTE — Patient Instructions (Signed)
Regrese a la clinica cuando tenga su cita. Si tiene problemas o preguntas, llama a la clinica o vaya a la sala de emerg[facility] al [facility].    

## 2014-04-19 ENCOUNTER — Other Ambulatory Visit: Payer: Self-pay | Admitting: Internal Medicine

## 2014-04-19 MED ORDER — OXYBUTYNIN CHLORIDE ER 5 MG PO TB24: 5.0000 mg | ORAL_TABLET | Freq: Every day | ORAL | Status: DC

## 2014-04-19 NOTE — Progress Notes (Unsigned)
As per urology recommendation patient needs to try anticholinergic therapy and if is she does not improve consider referral to urology. I Have started patient on Ditropan 5 mg, call and advise patient to start taking this medication(e- prescription sent to [facility])

## 2014-04-20 ENCOUNTER — Telehealth: Payer: Self-pay

## 2014-04-20 NOTE — Telephone Encounter (Signed)
Left voicemail for patient to return call. Need to inform patient that Ditropan 5 mg has been sent to pharmacy. Awaiting return call.

## 2014-04-23 ENCOUNTER — Telehealth: Payer: Self-pay | Admitting: Internal Medicine

## 2014-04-23 NOTE — Telephone Encounter (Signed)
Pt returning call and needs spanish interpreter.  Also pt is currently working and is not able to pick up phone if possible leave message on voicemail.

## 2014-04-24 ENCOUNTER — Telehealth: Payer: Self-pay

## 2014-04-24 NOTE — Telephone Encounter (Signed)
Interpreter line used Patient is aware of her results and will pick up the ditropan at [facility]

## 2014-06-11 ENCOUNTER — Ambulatory Visit: Payer: No Typology Code available for payment source | Attending: Internal Medicine | Admitting: Internal Medicine

## 2014-06-11 ENCOUNTER — Encounter: Payer: Self-pay | Admitting: Internal Medicine

## 2014-06-11 VITALS — BP 106/67 | HR 65 | Temp 98.7°F | Resp 17 | Wt 155.0 lb

## 2014-06-11 DIAGNOSIS — M5380 Other specified dorsopathies, site unspecified: Secondary | ICD-10-CM | POA: Insufficient documentation

## 2014-06-11 DIAGNOSIS — M545 Low back pain, unspecified: Secondary | ICD-10-CM | POA: Insufficient documentation

## 2014-06-11 DIAGNOSIS — Z139 Encounter for screening, unspecified: Secondary | ICD-10-CM

## 2014-06-11 DIAGNOSIS — M62830 Muscle spasm of back: Secondary | ICD-10-CM

## 2014-06-11 DIAGNOSIS — M549 Dorsalgia, unspecified: Secondary | ICD-10-CM

## 2014-06-11 DIAGNOSIS — Z791 Long term (current) use of non-steroidal anti-inflammatories (NSAID): Secondary | ICD-10-CM | POA: Insufficient documentation

## 2014-06-11 DIAGNOSIS — Z8489 Family history of other specified conditions: Secondary | ICD-10-CM | POA: Insufficient documentation

## 2014-06-11 DIAGNOSIS — M2550 Pain in unspecified joint: Secondary | ICD-10-CM | POA: Insufficient documentation

## 2014-06-11 LAB — RHEUMATOID FACTOR: Rhuematoid fact SerPl-aCnc: <10 IU/mL (ref <=14)

## 2014-06-11 MED ORDER — CYCLOBENZAPRINE HCL 10 MG PO TABS: 10.0000 mg | ORAL_TABLET | Freq: Every day | ORAL | Status: DC

## 2014-06-11 MED ORDER — IBUPROFEN 800 MG PO TABS: 800.0000 mg | ORAL_TABLET | Freq: Three times a day (TID) | ORAL | Status: AC | PRN

## 2014-06-11 MED ORDER — OXYBUTYNIN CHLORIDE ER 5 MG PO TB24: 5.0000 mg | ORAL_TABLET | Freq: Every day | ORAL | Status: DC

## 2014-06-11 NOTE — Progress Notes (Signed)
MRN: [id number] Name: Jackolyn ConferMartha Moreno-Ruiz  Sex: female Age: 43 y.o. DOB: Dec 11, 1971  Allergies: Review of patient's allergies indicates no known allergies.  Chief Complaint  Patient presents with  . Back Pain    HPI: Patient is 43 y.o. female who comes today reported to have low back pain for the last one week, she denies any fall or trauma, she also has history of urine incontinence which is unchanged and is taking Ditropan, as per patient she had a [facility] and was told that her blood test came back positive for lupus, patient is very concerned since she has family history of lupus and reported to have some joint pain, she wants to be checked again, denies any fever chills chest pain shortness of breath.  Past Medical History  Diagnosis Date  . Transverse myelitis   . Medical history non-contributory     Past Surgical History  Procedure Laterality Date  . No past surgeries        Medication List       This list is accurate as of: 06/11/14  9:49 AM.  Always use your most recent med list.               cyclobenzaprine 10 MG tablet  Commonly known as:  FLEXERIL  Take 1 tablet (10 mg total) by mouth at bedtime.     ibuprofen 800 MG tablet  Commonly known as:  ADVIL,MOTRIN  Take 1 tablet (800 mg total) by mouth every 8 (eight) hours as needed.     multivitamin with minerals Tabs tablet  Take 1 tablet by mouth daily.     naproxen 250 MG tablet  Commonly known as:  NAPROSYN  Take 500 mg by mouth 2 (two) times daily with a meal.     oxybutynin 5 MG 24 hr tablet  Commonly known as:  DITROPAN-XL  Take 1 tablet (5 mg total) by mouth at bedtime.        Meds ordered this encounter  Medications  . ibuprofen (ADVIL,MOTRIN) 800 MG tablet    Sig: Take 1 tablet (800 mg total) by mouth every 8 (eight) hours as needed.    Dispense:  30 tablet    Refill:  1  . cyclobenzaprine (FLEXERIL) 10 MG tablet    Sig: Take 1 tablet (10 mg  total) by mouth at bedtime.    Dispense:  30 tablet    Refill:  1    Immunization History  Administered Date(s) Administered  . Td 07/20/2008    Family History  Problem Relation Age of Onset  . Asthma Mother   . Cancer Mother   . Cancer Sister   . Cancer Maternal Aunt     History  Substance Use Topics  . Smoking status: Never Smoker   . Smokeless tobacco: Not on file  . Alcohol Use: No    Review of Systems   As noted in HPI  Filed Vitals:   06/11/14 0926  BP: 106/67  Pulse: 65  Temp: 98.7 F (37.1 C)  Resp: 17    Physical Exam  Physical Exam  Constitutional: No distress.  Eyes: EOM are normal. Pupils are equal, round, and reactive to light.  Cardiovascular: Normal rate and regular rhythm.   Pulmonary/Chest: Breath sounds normal. No respiratory distress. She has no wheezes. She has no rales.  Musculoskeletal:  Left lower lumbar paraspinal tenderness, with SLR test patient complains of back discomfort, DTR 2+ , equal  strength lower extremities     CBC    Component Value Date/Time   WBC 8.8 03/08/2014 1746   RBC 4.01 03/08/2014 1746   HGB 12.4 03/08/2014 1746   HCT 36.1 03/08/2014 1746   PLT 153 03/08/2014 1746   MCV 90.0 03/08/2014 1746   LYMPHSABS 2.4 12/14/2013 1221   MONOABS 0.6 12/14/2013 1221   EOSABS 0.1 12/14/2013 1221   BASOSABS 0.0 12/14/2013 1221    CMP     Component Value Date/Time   NA 138 12/14/2013 1221   K 3.9 12/14/2013 1221   CL 103 12/14/2013 1221   CO2 25 12/14/2013 1221   GLUCOSE 84 12/14/2013 1221   BUN 12 12/14/2013 1221   CREATININE 0.66 12/14/2013 1221   CREATININE 0.72 07/20/2008 2128   CALCIUM 8.9 12/14/2013 1221   PROT 7.3 12/14/2013 1221   ALBUMIN 4.2 12/14/2013 1221   AST 18 12/14/2013 1221   ALT 14 12/14/2013 1221   ALKPHOS 65 12/14/2013 1221   BILITOT 0.6 12/14/2013 1221    Lab Results  Component Value Date/Time   CHOL 129 12/14/2013 12:21 PM    No components found with this basename: hga1c    Lab  Results  Component Value Date/Time   AST 18 12/14/2013 12:21 PM    Assessment and Plan  Joint pain - Plan: Ordered rheumatologic workup, patient is anxious and has family history of lupus ANA, ibuprofen (ADVIL,MOTRIN) 800 MG tablet, Rheumatoid factor, Cyclic Citrul Peptide Antibody, IGG  Back pain Have prescribed ibuprofen.  Back spasm - Plan: cyclobenzaprine (FLEXERIL) 10 MG tablet, also advised to apply heating pad.  Screening - Plan: MM DIGITAL SCREENING BILATERAL   Health Maintenance  -Mammogram: ordered   Return in about 3 months (around 09/11/2014).  Doris CheadleADVANI, Rhyanna Sorce, MD

## 2014-06-11 NOTE — Progress Notes (Signed)
Patient here with interpreter Complains of lower back pain for the past week Denies any injury

## 2014-06-12 ENCOUNTER — Telehealth: Payer: Self-pay

## 2014-06-12 LAB — CYCLIC CITRUL PEPTIDE ANTIBODY, IGG: Cyclic Citrullin Peptide Ab: <2 U/mL (ref 0.0–5.0)

## 2014-06-12 LAB — ANA: Anti Nuclear Antibody(ANA): NEGATIVE

## 2014-06-12 NOTE — Telephone Encounter (Signed)
Interpreter line used Patient not available Message left on voice mail to return our call 

## 2014-06-12 NOTE — Telephone Encounter (Signed)
Pt calling back nurse.  Pt says its best call in the mornings around 9am before work time.

## 2014-06-12 NOTE — Telephone Encounter (Signed)
Message copied by Lestine MountJUAREZ, DENISE L on Tue Jun 12, 2014 12:43 PM ------      Message from: Doris CheadleADVANI, DEEPAK      Created: Tue Jun 12, 2014 12:13 PM       Call and let the patient know that her test for lupus and rheumatoid arthritis is negative ------

## 2014-06-14 ENCOUNTER — Telehealth: Payer: Self-pay

## 2014-06-14 NOTE — Telephone Encounter (Signed)
Interpreter line used Returned patient call Patient not available Message left on voice mail to return our call

## 2014-06-15 NOTE — Telephone Encounter (Signed)
Pt. Returning call. Pls call her back on Tuesday 6/23 at 12:00PM.

## 2014-06-19 ENCOUNTER — Telehealth: Payer: Self-pay

## 2014-06-19 DIAGNOSIS — N644 Mastodynia: Secondary | ICD-10-CM

## 2014-06-19 NOTE — Telephone Encounter (Signed)
Interpreter line used Patient not available  message left on voice mail to return our call

## 2014-06-19 NOTE — Telephone Encounter (Signed)
Interpreter line used Patient is aware of her lab results Also complains of pain to left breast [facility]

## 2014-06-28 ENCOUNTER — Other Ambulatory Visit: Payer: Self-pay | Admitting: Internal Medicine

## 2014-06-28 DIAGNOSIS — N644 Mastodynia: Secondary | ICD-10-CM

## 2014-07-16 ENCOUNTER — Ambulatory Visit
Admission: RE | Admit: 2014-07-16 | Discharge: 2014-07-16 | Disposition: A | Discharge status: Home / Self Care | Payer: No Typology Code available for payment source | Source: Ambulatory Visit | Attending: Internal Medicine | Admitting: Internal Medicine

## 2014-07-16 DIAGNOSIS — N644 Mastodynia: Secondary | ICD-10-CM

## 2014-08-30 IMAGING — MG MM DIGITAL DIAGNOSTIC BILAT
5 series · 5 of 5 positions shown · non-contrast
Comparison: 05/18/2011

CLINICAL DATA: Probable fibroadenoma left breast imaged April 2011.
The patient was asked to return for follow-up in 6 months, but could
not return until now. A Spanish interpreter is present for the exam.
The patient is 42 years old.

EXAM:
DIGITAL DIAGNOSTIC  BILATERAL MAMMOGRAM WITH CAD
ULTRASOUND LEFT BREAST

[R CC]
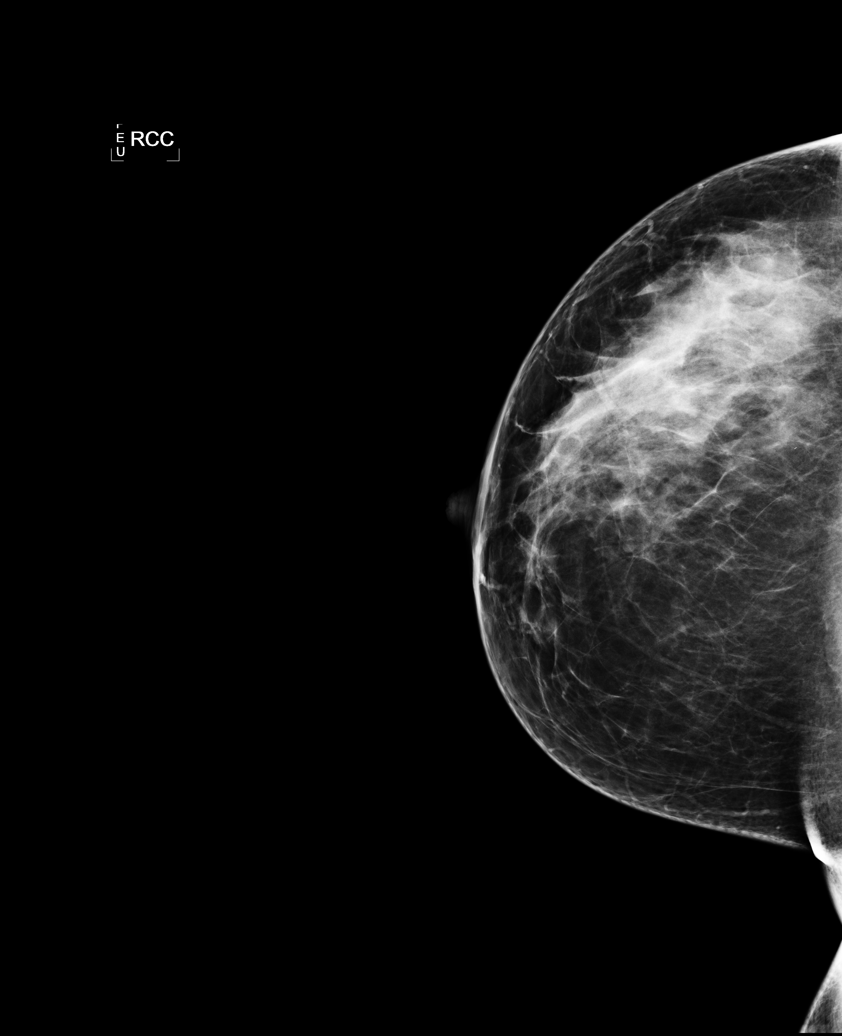

[L CC]
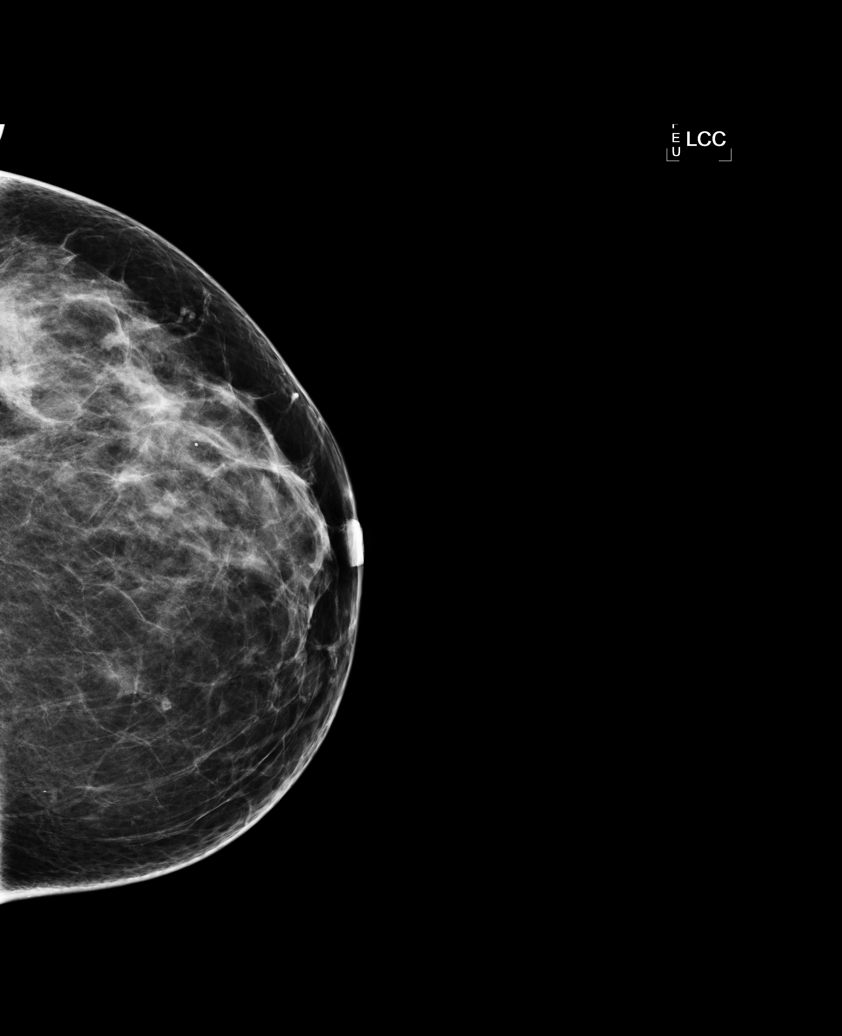

[L MLO (1 of 2)]
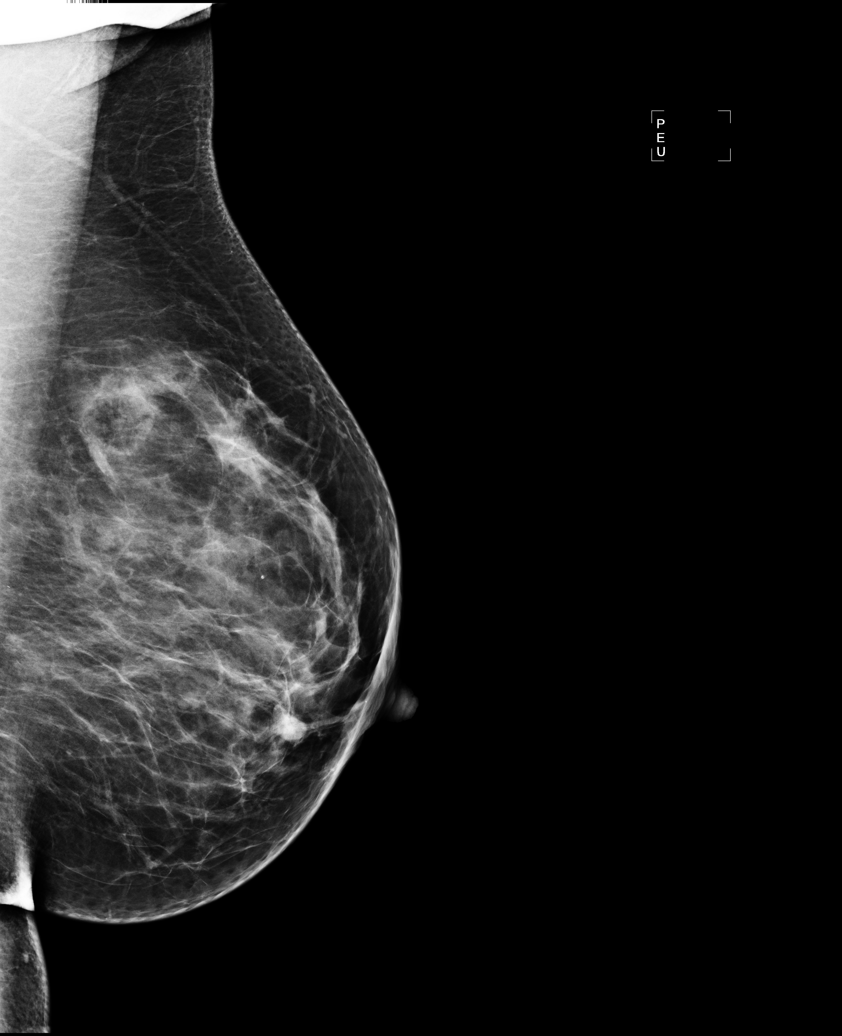

[R MLO]
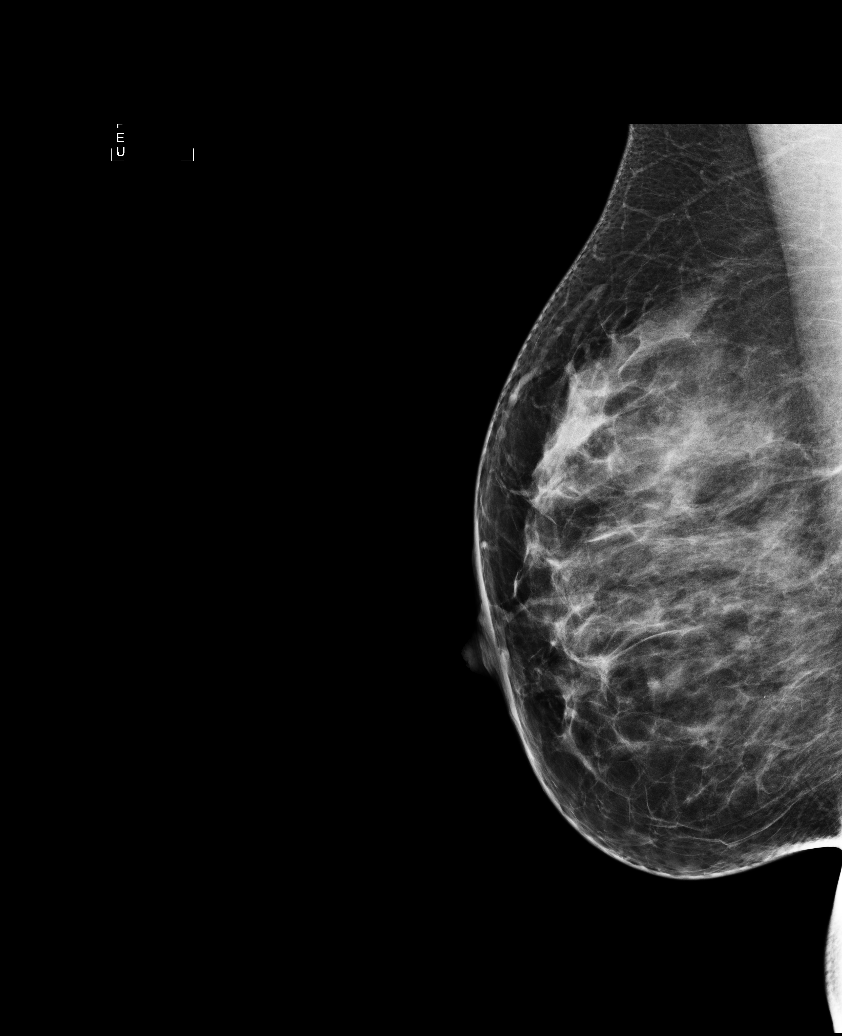

[L MLO (2 of 2)]
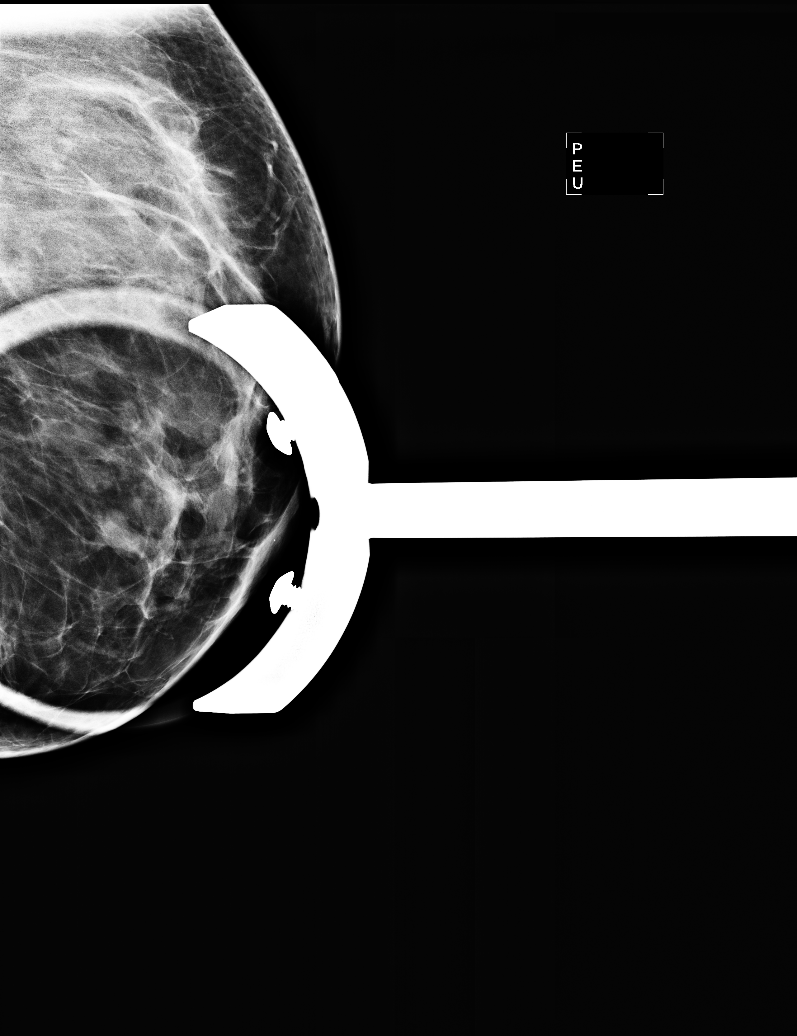

[5 of 5 positions shown; findings below may reference images not displayed]

ACR Breast Density Category c: The breast tissue is heterogeneously
dense, which may obscure small masses.
FINDINGS: No suspicious mass, distortion, or suspicious microcalcification is
identified in either breast to suggest malignancy.

Mammographic images were processed with CAD.

On physical exam, I do not palpate a mass in the superior left
breast.

Ultrasound is performed, showing a slightly hypoechoic lobulated
solid mass at 11 o'clock position 7 cm from the nipple that measures
1.3 x 0.4 x 1.0 cm. This measures slightly smaller compared to the
ultrasound April 2011, and can be considered a benign fibroadenoma.
IMPRESSION: Benign fibroadenoma 11 o'clock position left breast. No evidence of
malignancy in either breast.

RECOMMENDATION:
Screening mammogram in one year.(Code:SR-C-CLC)

I have discussed the findings and recommendations with the patient.
Results were also provided in writing at the conclusion of the
visit. If applicable, a reminder letter will be sent to the patient
regarding the next appointment.

BI-RADS CATEGORY  2: Benign.

## 2014-10-29 ENCOUNTER — Encounter: Payer: Self-pay | Admitting: Internal Medicine

## 2014-11-27 ENCOUNTER — Encounter: Payer: Self-pay | Admitting: Internal Medicine

## 2014-11-27 ENCOUNTER — Ambulatory Visit: Payer: Self-pay | Attending: Internal Medicine | Admitting: Internal Medicine

## 2014-11-27 VITALS — BP 124/77 | HR 68 | Temp 98.0°F | Resp 16 | Wt 158.5 lb

## 2014-11-27 DIAGNOSIS — N898 Other specified noninflammatory disorders of vagina: Secondary | ICD-10-CM | POA: Insufficient documentation

## 2014-11-27 DIAGNOSIS — N939 Abnormal uterine and vaginal bleeding, unspecified: Secondary | ICD-10-CM | POA: Insufficient documentation

## 2014-11-27 DIAGNOSIS — L298 Other pruritus: Secondary | ICD-10-CM | POA: Insufficient documentation

## 2014-11-27 DIAGNOSIS — G373 Acute transverse myelitis in demyelinating disease of central nervous system: Secondary | ICD-10-CM | POA: Insufficient documentation

## 2014-11-27 DIAGNOSIS — Z791 Long term (current) use of non-steroidal anti-inflammatories (NSAID): Secondary | ICD-10-CM | POA: Insufficient documentation

## 2014-11-27 DIAGNOSIS — Z202 Contact with and (suspected) exposure to infections with a predominantly sexual mode of transmission: Secondary | ICD-10-CM | POA: Insufficient documentation

## 2014-11-27 LAB — POCT URINALYSIS DIPSTICK
Bilirubin, UA: NEGATIVE
Glucose, UA: NEGATIVE
Ketones, UA: NEGATIVE
NITRITE UA: NEGATIVE
Protein, UA: NEGATIVE
RBC UA: NEGATIVE
Spec Grav, UA: 1.025
Urobilinogen, UA: 0.2
pH, UA: 6

## 2014-11-27 MED ORDER — FLUCONAZOLE 150 MG PO TABS: 150.0000 mg | ORAL_TABLET | Freq: Once | ORAL | Status: DC

## 2014-11-27 MED ORDER — AZITHROMYCIN 250 MG PO TABS: 1000.0000 mg | ORAL_TABLET | Freq: Once | ORAL | Status: DC

## 2014-11-27 MED ORDER — AZITHROMYCIN 500 MG PO TABS: ORAL_TABLET | ORAL | Status: DC

## 2014-11-27 MED ORDER — CEFTRIAXONE SODIUM 250 MG IJ SOLR: 250.0000 mg | Freq: Once | INTRAMUSCULAR | Status: DC

## 2014-11-27 MED ORDER — OXYBUTYNIN CHLORIDE ER 10 MG PO TB24: 10.0000 mg | ORAL_TABLET | Freq: Every day | ORAL | Status: DC

## 2014-11-27 NOTE — Progress Notes (Signed)
MRN: [id number] Name: [name]  Sex: female Age: 43 y.o. DOB: [birth year]  Allergies: Review of patient's allergies indicates no known allergies.  Chief Complaint  Patient presents with  . Follow-up    HPI: Patient is 43 y.o. female who comes today reported her to have gone to [facility] ago with symptoms of vaginal bleeding, she also reports having vaginal discharge, ?she got the telephone call from the hospital that her tests came back positive for gonorrhea she is also having vaginal itching, she denies any dysuria but has history of urinary incontinence and takes oxybutynin 5 mg which as per patient is not helping much . Patient also denies multiple sexual partners and has been using barrier method.  Past Medical History  Diagnosis Date  . Transverse myelitis   . Medical history non-contributory     Past Surgical History  Procedure Laterality Date  . No past surgeries        Medication List       This list is accurate as of: 11/27/14  4:45 PM.  Always use your most recent med list.               azithromycin 250 MG tablet  Commonly known as:  ZITHROMAX  Take 4 tablets (1,000 mg total) by mouth once. Take 1000mg  now in office     cefTRIAXone 250 MG injection  Commonly known as:  ROCEPHIN  Inject 250 mg into the muscle once. FOR IM use in LARGE MUSCLE MASS     cyclobenzaprine 10 MG tablet  Commonly known as:  FLEXERIL  Take 1 tablet (10 mg total) by mouth at bedtime.     fluconazole 150 MG tablet  Commonly known as:  DIFLUCAN  Take 1 tablet (150 mg total) by mouth once.     ibuprofen 800 MG tablet  Commonly known as:  ADVIL,MOTRIN  Take 1 tablet (800 mg total) by mouth every 8 (eight) hours as needed.     multivitamin with minerals Tabs tablet  Take 1 tablet by mouth daily.     naproxen 250 MG tablet  Commonly known as:  NAPROSYN  Take 500 mg by mouth 2 (two) times daily with a meal.     oxybutynin 10 MG 24  hr tablet  Commonly known as:  DITROPAN-XL  Take 1 tablet (10 mg total) by mouth at bedtime.        Meds ordered this encounter  Medications  . DISCONTD: azithromycin (ZITHROMAX) 500 MG tablet    Sig: Take 2 pills    Dispense:  2 tablet    Refill:  0  . fluconazole (DIFLUCAN) 150 MG tablet    Sig: Take 1 tablet (150 mg total) by mouth once.    Dispense:  1 tablet    Refill:  0  . oxybutynin (DITROPAN-XL) 10 MG 24 hr tablet    Sig: Take 1 tablet (10 mg total) by mouth at bedtime.    Dispense:  30 tablet    Refill:  3  . cefTRIAXone (ROCEPHIN) 250 MG injection    Sig: Inject 250 mg into the muscle once. FOR IM use in LARGE MUSCLE MASS    Dispense:  1 each    Refill:  0  . azithromycin (ZITHROMAX) 250 MG tablet    Sig: Take 4 tablets (1,000 mg total) by mouth once. Take 1000mg  now in office    Dispense:  6 each    Refill:  0    Immunization History  Administered Date(s) Administered  . Td 07/20/2008    Family History  Problem Relation Age of Onset  . Asthma Mother   . Cancer Mother   . Cancer Sister   . Cancer Maternal Aunt     History  Substance Use Topics  . Smoking status: Never Smoker   . Smokeless tobacco: Not on file  . Alcohol Use: No    Review of Systems   As noted in HPI  Filed Vitals:   11/27/14 1539  BP: 124/77  Pulse: 68  Temp: 98 F (36.7 C)  Resp: 16    Physical Exam  Physical Exam  Constitutional: No distress.  Eyes: EOM are normal. Pupils are equal, round, and reactive to light.  Cardiovascular: Normal rate and regular rhythm.   Pulmonary/Chest: Breath sounds normal. No respiratory distress. She has no wheezes. She has no rales.  Abdominal: Soft. There is no tenderness. There is no rebound.  Musculoskeletal: She exhibits no edema.    CBC    Component Value Date/Time   WBC 8.8 03/08/2014 1746   RBC 4.01 03/08/2014 1746   HGB 12.4 03/08/2014 1746   HCT 36.1 03/08/2014 1746   PLT 153 03/08/2014 1746   MCV 90.0 03/08/2014  1746   LYMPHSABS 2.4 12/14/2013 1221   MONOABS 0.6 12/14/2013 1221   EOSABS 0.1 12/14/2013 1221   BASOSABS 0.0 12/14/2013 1221    CMP     Component Value Date/Time   NA 138 12/14/2013 1221   K 3.9 12/14/2013 1221   CL 103 12/14/2013 1221   CO2 25 12/14/2013 1221   GLUCOSE 84 12/14/2013 1221   BUN 12 12/14/2013 1221   CREATININE 0.66 12/14/2013 1221   CREATININE 0.72 07/20/2008 2128   CALCIUM 8.9 12/14/2013 1221   PROT 7.3 12/14/2013 1221   ALBUMIN 4.2 12/14/2013 1221   AST 18 12/14/2013 1221   ALT 14 12/14/2013 1221   ALKPHOS 65 12/14/2013 1221   BILITOT 0.6 12/14/2013 1221    Lab Results  Component Value Date/Time   CHOL 129 12/14/2013 12:21 PM    No components found for: HGA1C  Lab Results  Component Value Date/Time   AST 18 12/14/2013 12:21 PM    Assessment and Plan  Vaginal itching - Plan:  Results for orders placed or performed in visit on 11/27/14  Urinalysis Dipstick  Result Value Ref Range   Color, UA yellow    Clarity, UA clear    Glucose, UA neg    Bilirubin, UA neg    Ketones, UA neg    Spec Grav, UA 1.025    Blood, UA neg    pH, UA 6.0    Protein, UA neg    Urobilinogen, UA 0.2    Nitrite, UA neg    Leukocytes, UA small (1+)    Urinalysis Dipstick show some leukocytes but nitrites negative fluconazole (DIFLUCAN) 150 MG tablet  Vaginal discharge - Plan: : azithromycin (ZITHROMAX) 500 MG tablet  Exposure to STD - Plan: will check GC/chlamydia probe amp, urine Suspicion was told she has Gonorrhea , she is given Rocephin 250 mg IM as well as thousand milligrams Zithromax to call for Chlamydia as well.  Vaginal bleeding - Plan: Ambulatory referral to Gynecology   Health Maintenance  -Pap Smear: referred to GYN -Mammogram: Dorris Carnesuptodate    Jacobo Moncrief, MD

## 2014-11-27 NOTE — Progress Notes (Signed)
Patient here with interpreter Patient was seen 11/16 at [facility] for heavy vaginal  Bleeding Patient states two days later she got a call telling her she had an STD(gonorrhea) but did not treat Was told to follow up with her primary care doctor Patient complains of vaginal itch and having a yellow odorless discharge

## 2014-11-28 ENCOUNTER — Telehealth: Payer: Self-pay | Admitting: *Deleted

## 2014-11-28 LAB — GC/CHLAMYDIA PROBE AMP, URINE
Chlamydia, Swab/Urine, PCR: NEGATIVE
GC Probe Amp, Urine: POSITIVE — AB

## 2014-11-28 NOTE — Telephone Encounter (Signed)
DOH form was faxed Left message to return call   Notes Recorded by Doris Cheadleeepak Advani, MD on 11/28/2014 at 12:13 PM Call and let the patient know that her test for gonorrhea came back positive, patient has already been treated with Rocephin, advise patient that her partner should be checked and treated. Also inform [facility].

## 2014-11-29 ENCOUNTER — Telehealth: Payer: Self-pay | Admitting: *Deleted

## 2014-11-29 NOTE — Telephone Encounter (Signed)
Pt aware of lab results, advice to communicate to partner/s.  Partner/s need to be test and tx

## 2014-12-04 ENCOUNTER — Encounter: Payer: Self-pay | Admitting: Obstetrics & Gynecology

## 2015-01-16 ENCOUNTER — Encounter: Payer: Self-pay | Admitting: Obstetrics & Gynecology

## 2016-03-25 ENCOUNTER — Ambulatory Visit: Payer: Self-pay | Admitting: Internal Medicine

## 2016-04-01 ENCOUNTER — Ambulatory Visit: Payer: Self-pay | Admitting: Internal Medicine

## 2016-05-18 ENCOUNTER — Ambulatory Visit: Payer: Commercial Managed Care - PPO | Attending: Internal Medicine | Admitting: Internal Medicine

## 2016-05-18 ENCOUNTER — Encounter: Payer: Self-pay | Admitting: Internal Medicine

## 2016-05-18 VITALS — BP 96/59 | HR 60 | Temp 98.3°F | Resp 12 | Ht 62.5 in | Wt 156.2 lb

## 2016-05-18 DIAGNOSIS — Z87898 Personal history of other specified conditions: Secondary | ICD-10-CM

## 2016-05-18 DIAGNOSIS — N644 Mastodynia: Secondary | ICD-10-CM

## 2016-05-18 DIAGNOSIS — N319 Neuromuscular dysfunction of bladder, unspecified: Secondary | ICD-10-CM

## 2016-05-18 DIAGNOSIS — Z0000 Encounter for general adult medical examination without abnormal findings: Secondary | ICD-10-CM

## 2016-05-18 LAB — TSH: TSH: 1.75 m[IU]/L

## 2016-05-18 LAB — CBC WITH DIFFERENTIAL/PLATELET
Basophils Absolute: 0 cells/uL (ref 0–200)
Basophils Relative: 0 %
Eosinophils Absolute: 67 cells/uL (ref 15–500)
Eosinophils Relative: 1 %
HEMATOCRIT: 37 % (ref 35.0–45.0)
Hemoglobin: 11.7 g/dL (ref 11.7–15.5)
LYMPHS PCT: 23 %
Lymphs Abs: 1541 cells/uL (ref 850–3900)
MCH: 27.4 pg (ref 27.0–33.0)
MCHC: 31.6 g/dL — ABNORMAL LOW (ref 32.0–36.0)
MCV: 86.7 fL (ref 80.0–100.0)
Monocytes Absolute: 469 cells/uL (ref 200–950)
Monocytes Relative: 7 %
Neutro Abs: 4623 cells/uL (ref 1500–7800)
Neutrophils Relative %: 69 %
PLATELETS: 168 10*3/uL (ref 140–400)
RBC: 4.27 MIL/uL (ref 3.80–5.10)
RDW: 15.7 % — AB (ref 11.0–15.0)
WBC: 6.7 10*3/uL (ref 3.8–10.8)

## 2016-05-18 LAB — BASIC METABOLIC PANEL WITH GFR
BUN: 11 mg/dL (ref 7–25)
CO2: 23 mmol/L (ref 20–31)
CREATININE: 0.61 mg/dL (ref 0.50–1.10)
Calcium: 8.6 mg/dL (ref 8.6–10.2)
Chloride: 105 mmol/L (ref 98–110)
GFR, Est African American: >89 mL/min (ref >=60)
GFR, Est Non African American: >89 mL/min (ref >=60)
Glucose, Bld: 100 mg/dL — ABNORMAL HIGH (ref 65–99)
POTASSIUM: 3.5 mmol/L (ref 3.5–5.3)
SODIUM: 136 mmol/L (ref 135–146)

## 2016-05-18 NOTE — Patient Instructions (Signed)
Plan de alimentacin cardiosaludable (Heart-Healthy Eating Plan) La planificacin de las comidas cardiosaludables incluye lo siguiente:  Limitar las grasas poco saludables.  Aumentar las grasas saludables.  Hacer otros pequeos cambios en la dieta. Es posible que tenga que hablar con el mdico o con un especialista en alimentacin (nutricionista) para crear un plan de alimentacin que sea adecuado para usted. QU TIPOS DE GRASAS DEBO ELEGIR?  Elija las grasas saludables. Estas incluyen aceite de oliva y de canola, semillas de lino, nueces, almendras y semillas.  Consuma ms grasas omega-3. Estas incluyen salmn, caballa, sardinas, atn, aceite de lino y semillas de lino molidas. Trate de comer pescado al Borders Group veces por semana.  Limite el consumo de grasas saturadas,  las cuales suelen [employer] productos de origen animal, como carnes, [place] y crema.  Las grasas saturadas de origen vegetal incluyen aceite de palma, de palmiste y de coco.  Evite los alimentos con aceites parcialmente hidrogenados. Estos incluyen margarina en barra, algunas margarinas untables, galletas, galletitas y otros productos horneados. Estos contienen grasas trans. QU PAUTAS GENERALES DEBO SEGUIR?  Lea atentamente las etiquetas de los alimentos. Identifique los que contienen grasas trans o altas cantidades de grasas saturadas.  Llene la mitad del plato con verduras y ensaladas de hojas verdes. Coma 4 o 5porciones de verduras Air cabin crew. Una porcin de verduras equivale a lo siguiente:  1 taza de verduras de hoja crudas.   taza de verduras en trozos crudas o cocidas.   taza de jugo de verduras.  Llene un cuarto del plato con cereales integrales. Busque la palabra "integral" en Estate agent de la lista de ingredientes.  Llene un cuarto del plato con alimentos con protenas magras.  Coma 4 o 5porciones de frutas por da. Una porcin de fruta equivale a lo siguiente:  Una fruta  mediana entera.  taza de fruta disecada.   taza de fruta fresca, congelada o enlatada.  taza de jugo 100% de fruta.  Consuma ms alimentos con fibra soluble, los cuales incluyen [place], brcoli, zanahorias, frijoles, guisantes y [place]. Trate de consumir de 20a 30g de [employer].  Coma ms comidas caseras. Coma menos comida de restaurantes, bufs y comida rpida.  Limite o evite el alcohol.  Limite los alimentos con alto contenido de almidn y International aid/development worker.  Evite las comidas fritas.  Evite frer los alimentos. En cambio, trate de cocinarlos en el horno, en la plancha o en la parrilla, o hervirlos. Tambin puede reducir las grasas de la siguiente forma:  Quite la piel de las aves.  Quite todas las grasas visibles de las carnes.  Espume la grasa de los guisos, las sopas y las salsas antes de servirlos.  Cocine al vapor las verduras en agua o caldo.  Baje de peso si es necesario.  Coma 4 o 5porciones de frutos secos, legumbres y semillas por semana:  Una porcin de frijoles o legumbres secos equivale a taza despus de su coccin.  Una porcin de frutos secos equivale a 1onzas.  Una porcin de semillas equivale a onza o una cucharada.  Tal vez deba llevar un registro de la cantidad de sal o sodio que ingiere, especialmente si tiene la presin hipertensin arterial. Hable con el mdico o el nutricionista para obtener ms informacin. QU ALIMENTOS PUEDO COMER? Cereales Panes, incluido el pan francs, blanco, pita, de Agenda, de pasas de [place], de centeno, de avena e Ashley. Tortillas que no estn fritas ni elaboradas con manteca de cerdo ni grasas trans.  Panecillos bajos en grasas, incluidos los panes para perros calientes y North Plainfieldhamburguesas, y los bollitos tipo ingls. Galletas. Muffins. Waffles. Panqueques. Palomitas de maz con bajo contenido calrico. Cereales integrales. Pan sin levadura. Tostada Melba. Pretzels. Palitos de pan. Galletas. Colaciones bajas en  grasas. Galletitas Henry Scheinbajas en grasas, Avery Dennisonentre ellas, las que tienen forma de Uplandostra, las Rush Hillsaladas, el pan cimo, las Lyons Fallsgalletas Graham, las que tienen forma de Breesportanimales y las de centeno. Arroz y pastas, incluido el arroz integral y las pastas elaboradas con cereales integrales.  Verduras Todas las verduras.  Frutas Todas las frutas, pero limite el Waynesborococo. Potreroarnes y Randolphotras fuentes de protenas Carne de res, Belizeternera, cerdo y cordero magras sin grasa. Pollo y pavo sin piel. Todos los pescados y Liberty Globalmariscos. Pato salvaje, conejo, faisn y venado. Claras de huevo o sustitutos del huevo bajos en colesterol. Porotos, guisantes, lentejas secos y tofu. Semillas y la mayora de los frutos secos. Lcteos Quesos descremados y semidescremados, entre ellos, ricota, queso en hebras y Garment/textile technologistmozzarella. Leche descremada o al 1% que sea lquida, en polvo o evaporada. Suero de WPS Resourcesleche elaborado con Molson Coors Brewingleche semidescremada. Yogur descremado o bajo en grasas. Bebidas Agua mineral. Bebidas gaseosas dietticas. Dulces y postres Sorbetes y helados de fruta. EscalonMiel, Holidaydulce, Ossianmermelada, jalea y Bristolalmbares. Merengues y gelatinas. Caramelos de Science Applications Internationalazcar pura, como caramelos duros, caramelos de goma, pastillas de goma, mentas, malvaviscos y pequeas cantidades de chocolate amargo. Torta ngel. Coma todos los dulces y postres con moderacin. Grasas y Writeraceites Margarinas no hidrogenadas (sin grasas trans). Aceites vegetales, incluido el de soja, ssamo, girasol, New Hopeoliva, man, crtamo, maz, canola y semillas de algodn. Alios para ensalada o mayonesa elaborados con aceite vegetal. Limite las grasas y los aceites agregados que Botswanausa para Water quality scientistcocinar, Development worker, communityhornear, preparar ensaladas y las cremas untables. Otros Cacao en polvo. T o caf. Todos los alios y condimentos. Los artculos mencionados arriba pueden no ser Raytheonuna lista completa de las bebidas o los alimentos recomendados. Comunquese con el nutricionista para conocer ms opciones. QU ALIMENTOS NO SE  RECOMIENDAN? Cereales Panes elaborados con grasas saturadas o trans, aceites o Eastman Kodakleche entera. Croissants. Panecillos de mantequilla. Panes de queso. Panecillos dulces. Rosquillas. Palomitas de maz con mantequilla. Fideos chow mein. Galletitas con FedExalto contenido de grasas, como las que contienen queso o Cactus Flatsmantequilla. Carnes y [address]ras fuentes de protenas Carnes grasas, como perros calientes, Sidneycostillas de res, [address] Eastsalchichas, puntas de Eagle Lakecostillar, Dry Prongpanceta, asado de Meridiancostillar o Eastonchuletn, y carnero. Fiambres altos en grasas, como salame y Freelandmortadela. Caviar. Pato y ganso domsticos. Vsceras, como riones, hgado, Newcomerstownmollejas, y Programmer, multimediacorazn. Lcteos Crema, crema agria, queso crema y Eufaulaqueso cottage con crema. Quesos elaborados con Eastman Kodakleche entera, incluido el queso Ellenvilleazul (bleu), LeawoodMonterey Jack, Port Clarencebrie, Campocolby, Loachapokaamericano, Sellersvillehavarti, suizo, cheddar, camembert y [place]muen[place]. Leche entera o al 2% que sea Barbadoslquida, evaporada o condensada. Suero de Liberty Globalleche entero. Salsa de crema o queso alta en grasas. Yogur elaborado con Eastman Kodakleche entera. Bebidas Refrescos regulares y jugos con agregado de International aid/development workerazcar. Dulces y Hughes Supplypostres Glaseados. Pudin. Galletas. Tortas que no sean la torta ngel. Caramelos que contengan chocolate con leche o chocolate blanco, grasa hidrogenada, mantequilla, coco o ingredientes desconocidos. Almbares con mantequilla. Helados o bebidas elaboradas con helado con alto contenido de grasas. Grasas y [address] Salsas que Libyan Arab Jamahiriyacontengan grasa, [place] (the territory South of 60 deg S)grasa de carne o materia grasa. Manteca de cacao, aceites hidrogenados, aceite de palma, aceite de coco, aceite de palmiste. A menudo, estos se encuentran en los productos horneados, los caramelos, las comidas fritas, las cremas no lcteas y las coberturas batidas. Grasas y Land O'Lakesmaterias  grasas slidas, incluida la grasa del tocino, el cerdo Altamontsalado, la West Salemmanteca de cerdo y Civil engineer, contractingla mantequilla. Sustitutos de crema no lctea, como cremas para caf y sustitutos de crema agria. Alios para ensaladas elaborados con aceites  desconocidos, queso o crema agria. Los artculos mencionados arriba pueden no ser Raytheonuna lista completa de las bebidas y los alimentos que se Theatre stage managerdeben evitar. Comunquese con el nutricionista para obtener ms informacin.   Esta informacin no tiene Theme park managercomo fin reemplazar el consejo del mdico. Asegrese de hacerle al mdico cualquier pregunta que tenga.   Document Released: 06/14/2012 Document Revised: 01/04/2015 Elsevier Interactive Patient Education Yahoo! Inc2016 Elsevier Inc.

## 2016-05-18 NOTE — Progress Notes (Signed)
[name], is a 45 y.o. female  WJX:[id number]  [id number]:[id number]  DOB - 04-12-1971  CC:  Chief Complaint  Patient presents with  . Establish Care  . Breast Pain       HPI: [name] is a 45 y.o. female here today to establish medical care.  Patient was last seen in our clinic in December 2015.  She was treated at that time for gonorrhea with Rocephin 250 mg IM 1 and 1000 mg of Zithromax for prophylactic chlamydia treatment.  She is back today with complaints of left-sided breast pain ongoing for the last year, worse so in the last few months.  She also mentions that she's currently on her menses, so the pains appear to be worse currently.  Last Pap smear was greater than 3 years ago.  Denies any breast nipple discharge.  She also mentions a history of transverse myelitis which causes neurogenic bladder for which she is requesting referral again for urology follow-up.  States she's had 3 vaginal deliveries, but right now her bladder incontinence is quite severe and and happens at any time, not related to stress incontinence. States cable exercises in the past has not helped her.    Patient, she hasn't that has breast cancer. Sister has leukemia and her mother had uterine cancer.  Patient has No headache, No chest pain, No abdominal pain - No Nausea, No new weakness tingling or numbness, No Cough - SOB.  Interpreter was used to communicate directly with patient for the entire encounter including providing detailed patient instructions.   No Known Allergies Past Medical History  Diagnosis Date  . Transverse myelitis (HCC)   . Medical history non-contributory    Current Outpatient Prescriptions on File Prior to Visit  Medication Sig Dispense Refill  . ibuprofen (ADVIL,MOTRIN) 800 MG tablet Take 1 tablet (800 mg total) by mouth every 8 (eight) hours as needed. 30 tablet 1  . Multiple Vitamin (MULTIVITAMIN WITH MINERALS) TABS tablet Take 1 tablet by mouth daily.      . naproxen (NAPROSYN) 250 MG tablet Take 500 mg by mouth 2 (two) times daily with a meal.    . azithromycin (ZITHROMAX) 250 MG tablet Take 4 tablets (1,000 mg total) by mouth once. Take 1000mg  now in office (Patient not taking: Reported on 05/18/2016) 6 each 0  . cefTRIAXone (ROCEPHIN) 250 MG injection Inject 250 mg into the muscle once. FOR IM use in LARGE MUSCLE MASS (Patient not taking: Reported on 05/18/2016) 1 each 0  . cyclobenzaprine (FLEXERIL) 10 MG tablet Take 1 tablet (10 mg total) by mouth at bedtime. (Patient not taking: Reported on 05/18/2016) 30 tablet 1  . fluconazole (DIFLUCAN) 150 MG tablet Take 1 tablet (150 mg total) by mouth once. (Patient not taking: Reported on 05/18/2016) 1 tablet 0  . oxybutynin (DITROPAN-XL) 10 MG 24 hr tablet Take 1 tablet (10 mg total) by mouth at bedtime. (Patient not taking: Reported on 05/18/2016) 30 tablet 3   No current facility-administered medications on file prior to visit.   Family History  Problem Relation Age of Onset  . Asthma Mother   . Cancer Mother   . Cancer Sister   . Cancer Maternal Aunt    Social History   Social History  . Marital Status: Single    Spouse Name: N/A  . Number of Children: N/A  . Years of Education: N/A   Occupational History  . Not on file.   Social History Main Topics  . Smoking status: Never  Smoker   . Smokeless tobacco: Not on file  . Alcohol Use: No  . Drug Use: No  . Sexual Activity: Not on file   Other Topics Concern  . Not on file   Social History Narrative    Review of Systems: Constitutional: Negative for fever, chills, diaphoresis, activity change, appetite change and fatigue. HENT: Negative for ear pain, nosebleeds, congestion, facial swelling, rhinorrhea, neck pain, neck stiffness and ear discharge.  Eyes: Negative for pain, discharge, redness, itching and visual disturbance. Respiratory: Negative for cough, choking, chest tightness, shortness of breath, wheezing and stridor.   Cardiovascular: Negative for chest pain, palpitations and leg swelling. Gastrointestinal: Negative for abdominal distention. Genitourinary: Negative for dysuria, frequency, hematuria, flank pain, decreased urine volume, difficulty urinating and dyspareunia.  +  Urgency and worsening urinary incontinence, did not recall what medication was prescribed by Uro last time.  Denies any abnormal vaginal discharge. Denies any breast discharge or palpable masses when she checks at home.  Musculoskeletal: Negative for   joint swelling, arthralgia and gait problem.  No acute c/o of back pain, but hx of transverse mellitis w/ neurogenic bladder. Neurological: Negative for dizziness, tremors, seizures, syncope, facial asymmetry, speech difficulty, weakness, light-headedness, numbness and headaches.  Hematological: Negative for adenopathy. Does not bruise/bleed easily. Psychiatric/Behavioral: Negative for hallucinations, behavioral problems, confusion, dysphoric mood, decreased concentration and agitation.    Objective:   Filed Vitals:   05/18/16 1140  BP: 96/59  Pulse: 60  Temp: 98.3 F (36.8 C)  Resp: 12    Filed Weights   05/18/16 1140  Weight: 156 lb 3.2 oz (70.852 kg)    BP Readings from Last 3 Encounters:  05/18/16 96/59  11/27/14 124/77  06/11/14 106/67    Physical Exam: Constitutional: Patient appears well-developed and well-nourished. No distress. AAOx3, pleasant. HENT: Normocephalic, atraumatic, External right and left ear normal. Oropharynx is clear and moist.  Eyes: Conjunctivae and EOM are normal. PERRL, no scleral icterus. Neck: Normal ROM. Neck supple. No JVD. No tracheal deviation. No thyromegaly/ goiter CVS: RRR, S1/S2 +, no murmurs, no gallops, no carotid bruit.  Pulmonary: Effort and breath sounds normal, no stridor, rhonchi, wheezes, rales.  Breast exam: bilateral breast exam done,  No palpable masses, noted ttp left breast at 1-3 O'clock region w/ dense breast tissue  noted, but no obvious masses palpated.  No nipple discharge bilatearally.  No adnexa masses palpable bilateral Abdominal: Soft. BS +, no distension, tenderness, rebound or guarding.  Musculoskeletal: Normal range of motion. No edema and no tenderness.  LE: bilat/ no c/c/e, pulses 2+ bilateral. Lymphadenopathy: No lymphadenopathy noted, cervical. Neuro: Alert. muscle tone coordination. No cranial nerve deficit grossly. Skin: Skin is warm and dry. No rash noted. Not diaphoretic. No erythema. No pallor. Psychiatric: Normal mood and affect. Behavior, judgment, thought content normal.  Lab Results  Component Value Date   WBC 8.8 03/08/2014   HGB 12.4 03/08/2014   HCT 36.1 03/08/2014   MCV 90.0 03/08/2014   PLT 153 03/08/2014   Lab Results  Component Value Date   CREATININE 0.66 12/14/2013   BUN 12 12/14/2013   NA 138 12/14/2013   K 3.9 12/14/2013   CL 103 12/14/2013   CO2 25 12/14/2013    No results found for: HGBA1C Lipid Panel     Component Value Date/Time   CHOL 129 12/14/2013 1221   TRIG 93 12/14/2013 1221   HDL 48 12/14/2013 1221   CHOLHDL 2.7 12/14/2013 1221   VLDL 19 12/14/2013 1221  LDLCALC 62 12/14/2013 1221       Depression screen PHQ 2/9 05/18/2016 08/02/2013  Decreased Interest 0 0  Down, Depressed, Hopeless 0 0  PHQ - 2 Score 0 0    Assessment and plan:   1. 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 - Ambulatory referral to Urology  2. History of fibrocystic disease of breast See #3  3. Pain of left breast, r/o malignancy - MM DIAG BREAST TOMO BILATERAL; Future - US BREAST COMPLETE UNI LEFT INC AXILLA; Future - US BREAST COMPLETE UNI RIGHT INC AXILLA; Future  4. Annual physical exam Pap smear on hold due to active menses, make appt in 2-3 wks to get done.; - BASIC METABOLIC PANEL WITH GFR - CBC with Differential - Hemoglobin A1c - Pregnancy, urine - TSH - pt not fasting, no lipids today.  Return in about 2 weeks (around 06/01/2016) for  papsmear.  The patient was given clear instructions to go to ER or return to medical center if symptoms don't improve, worsen or new problems develop. The patient verbalized understanding. The patient was told to call to get lab results if they haven't heard anything in the next week.      Pete Glatterawn T Langeland, MD, MBA/MHA [facility] [place], [place] [phone]   05/18/2016, 12:07 PM

## 2016-05-18 NOTE — Progress Notes (Signed)
Pt here to establish care and for L breast pain. Pain rated at a 5, described as stabbing. Pain is constant and has been present for 3 months now.

## 2016-05-19 ENCOUNTER — Other Ambulatory Visit (HOSPITAL_COMMUNITY): Payer: Self-pay | Admitting: Interventional Radiology

## 2016-05-19 LAB — HEMOGLOBIN A1C
Hgb A1c MFr Bld: 5.6 % (ref <5.7)
MEAN PLASMA GLUCOSE: 114 mg/dL

## 2016-05-19 LAB — PREGNANCY, URINE: PREG TEST UR: NEGATIVE

## 2016-06-02 ENCOUNTER — Other Ambulatory Visit: Payer: Commercial Managed Care - PPO | Admitting: Internal Medicine

## 2016-06-02 ENCOUNTER — Telehealth: Payer: Self-pay | Admitting: *Deleted

## 2016-06-02 NOTE — Telephone Encounter (Signed)
Medical Assistant used [employer] to contact patient.  Interpreter Name: Erich MontaneJoarah Interpreter #: [phone]  Patient verified DOB Patient is aware of labs being normal including DM, kidneys, Blood count and thyroid. Patient is also aware of PT being negative. Patient has appointment on 06/10/16 for a pap smear.

## 2016-06-02 NOTE — Telephone Encounter (Signed)
-----   Message from Pete Glatterawn T Langeland, MD sent at 05/19/2016  8:50 AM EDT ----- Please call w/ results.  All labs normal. Thyroid normal, not pregnant, no dm, kidneys normal, and no anemia. Make sure pt makes appt for pap smear. thanks

## 2016-06-10 ENCOUNTER — Ambulatory Visit: Payer: Commercial Managed Care - PPO | Attending: Internal Medicine | Admitting: Internal Medicine

## 2016-06-10 ENCOUNTER — Other Ambulatory Visit (HOSPITAL_COMMUNITY)
Admission: RE | Admit: 2016-06-10 | Discharge: 2016-06-10 | Disposition: A | Discharge status: Home / Self Care | Payer: Commercial Managed Care - PPO | Source: Ambulatory Visit | Attending: Internal Medicine | Admitting: Internal Medicine

## 2016-06-10 ENCOUNTER — Encounter: Payer: Self-pay | Admitting: Internal Medicine

## 2016-06-10 VITALS — BP 105/72 | HR 79 | Temp 99.0°F | Wt 155.0 lb

## 2016-06-10 DIAGNOSIS — Z113 Encounter for screening for infections with a predominantly sexual mode of transmission: Secondary | ICD-10-CM | POA: Insufficient documentation

## 2016-06-10 DIAGNOSIS — N86 Erosion and ectropion of cervix uteri: Secondary | ICD-10-CM

## 2016-06-10 DIAGNOSIS — Z124 Encounter for screening for malignant neoplasm of cervix: Secondary | ICD-10-CM | POA: Diagnosis not present

## 2016-06-10 DIAGNOSIS — N644 Mastodynia: Secondary | ICD-10-CM

## 2016-06-10 DIAGNOSIS — Z01419 Encounter for gynecological examination (general) (routine) without abnormal findings: Secondary | ICD-10-CM | POA: Insufficient documentation

## 2016-06-10 DIAGNOSIS — N760 Acute vaginitis: Secondary | ICD-10-CM | POA: Diagnosis present

## 2016-06-10 NOTE — Patient Instructions (Signed)
-   keep Mammogram appt - referral specialist will call you 1-2 wks for OB appt.

## 2016-06-10 NOTE — Progress Notes (Signed)
[name], is a 45 y.o. female  ZOX:[id number]  [id number]:[id number]  DOB - April 11, 1971  Chief Complaint  Patient presents with  . Gynecologic Exam    PAP only        Subjective:   [name] is a 45 y.o. female here today for a follow up visit for pap smear.  Last menses 05/20/16., regular cycle.  She denies dysmennorhea. Still has left sided breast pain, dull ache most days, worse when having menses.   Patient has No headache, No chest pain, No abdominal pain - No Nausea, No new weakness tingling or numbness, No Cough - SOB.   Interpreter was used to communicate directly with patient for the entire encounter including providing detailed patient instructions.    No problems updated.  ALLERGIES: No Known Allergies  PAST MEDICAL HISTORY: Past Medical History  Diagnosis Date  . Transverse myelitis (HCC)   . Medical history non-contributory     MEDICATIONS AT HOME: Prior to Admission medications   Medication Sig Start Date End Date Taking? Authorizing Provider  ibuprofen (ADVIL,MOTRIN) 800 MG tablet Take 1 tablet (800 mg total) by mouth every 8 (eight) hours as needed. 06/11/14  Yes Doris Cheadle, MD  Multiple Vitamin (MULTIVITAMIN WITH MINERALS) TABS tablet Take 1 tablet by mouth daily.   Yes Historical Provider, MD     Objective:   Filed Vitals:   06/10/16 1145  BP: 105/72  Pulse: 79  Temp: 99 F (37.2 C)  TempSrc: Oral  Weight: 155 lb (70.308 kg)   CMA present during Pap exam  Exam General appearance : Awake, alert, not in any distress. Speech Clear. Not toxic looking HEENT: Atraumatic and Normocephalic Neck: supple, no JVD. No cervical lymphadenopathy.  Chest:Good air entry bilaterally, no added sounds. BREAST  Exam: right and left breast /axilla unremarkable, dense tissue throughout breast, mild ttp in 4-6 O'clock quadrant on Left breast, but no palpable masses.  CVS: S1 S2 regular, no murmurs/gallups or rubs. Abdomen: Bowel sounds  active, Non tender Pelvic Exam: Cervix abnormal in appearance, irregular edges, darker pigmentation around cervical os, external genitalia normal, no adnexal masses or tenderness, no cervical motion tenderness, rectovaginal septum normal, uterus normal size, shape, and consistency and vagina w/ loose/white discharge, no odor.  Extremities: B/L Lower Ext shows no edema, both legs are warm to touch Neurology: Awake alert, and oriented X 3, CN II-XII grossly intact, Non focal Skin:No Rash  Data Review Lab Results  Component Value Date   HGBA1C 5.6 05/18/2016    Depression screen [facility] 2/9 06/10/2016 05/18/2016 08/02/2013  Decreased Interest 0 0 0  Down, Depressed, Hopeless 0 0 0  PHQ - 2 Score 0 0 0  Altered sleeping 0 - -  Tired, decreased energy 0 - -  Change in appetite 0 - -  Feeling bad or failure about yourself  0 - -  Trouble concentrating 0 - -  Moving slowly or fidgety/restless 0 - -  Suicidal thoughts 0 - -  PHQ-9 Score 0 - -  Difficult doing work/chores Not difficult at all - -   Mm 7/15 IMPRESSION: 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 No evidence of malignancy in either breast.  RECOMMENDATION: Screening mammogram in one year.(Code:SM-B-01Y)  I have discussed the findings and recommendations with the patient. Results were also provided in writing at the conclusion of the visit. If applicable, a reminder letter will be sent to the patient regarding the next appointment.  BI-RADS CATEGORY 2: Benign.   Assessment &  Plan   1. Pap smear for cervical cancer screening - Cytology - PAP /wetprep done.  2. 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 Dx MM [facility] Milo/breast US scheduled in July, per Pt.  3. Cervical ulceration /abnormality on phy exam - Cytology - PAP - Ambulatory referral to Obstetrics / Gynecology - for closer eval     Patient have been counseled extensively about nutrition and exercise  Return in  about 3 months (around 09/10/2016), or if symptoms worsen or fail to improve.  The patient was given clear instructions to go to ER or return to medical center if symptoms don't improve, worsen or new problems develop. The patient verbalized understanding. The patient was told to call to get lab results if they haven't heard anything in the next week.    Pete Glatterawn T Stassi Fadely, MD, MBA/MHA [facility] [place], [place] [phone]   06/10/2016, 12:10 PM

## 2016-06-11 ENCOUNTER — Other Ambulatory Visit: Payer: Self-pay | Admitting: Internal Medicine

## 2016-06-11 LAB — CYTOLOGY - PAP

## 2016-06-11 LAB — CERVICOVAGINAL ANCILLARY ONLY: Wet Prep (BD Affirm): POSITIVE — AB

## 2016-06-11 MED ORDER — METRONIDAZOLE 500 MG PO TABS: 500.0000 mg | ORAL_TABLET | Freq: Two times a day (BID) | ORAL | Status: DC

## 2016-06-19 ENCOUNTER — Ambulatory Visit
Admission: RE | Admit: 2016-06-19 | Discharge: 2016-06-19 | Disposition: A | Discharge status: Home / Self Care | Payer: Commercial Managed Care - PPO | Source: Ambulatory Visit | Attending: Internal Medicine | Admitting: Internal Medicine

## 2016-06-19 ENCOUNTER — Telehealth: Payer: Self-pay | Admitting: Internal Medicine

## 2016-06-19 ENCOUNTER — Other Ambulatory Visit: Payer: Self-pay | Admitting: Internal Medicine

## 2016-06-19 ENCOUNTER — Other Ambulatory Visit: Payer: Commercial Managed Care - PPO

## 2016-06-19 DIAGNOSIS — N6310 Unspecified lump in the right breast, unspecified quadrant: Secondary | ICD-10-CM

## 2016-06-19 DIAGNOSIS — N644 Mastodynia: Secondary | ICD-10-CM

## 2016-06-19 NOTE — Telephone Encounter (Signed)
Verbal order is needed to scan right breast. Patient has appointment today. Please follow up.

## 2016-06-22 NOTE — Telephone Encounter (Signed)
Verbal order was given on 06/19/2016.

## 2016-06-25 NOTE — Telephone Encounter (Signed)
Pt. Called stating she had received a call form the facility.  I told pt. That no note was in the system that the nurse had  Tried to call her. Please f/u with pt.

## 2016-07-01 NOTE — Telephone Encounter (Signed)
Patient called returning nurse's call to review pap smear results.

## 2016-07-01 NOTE — Telephone Encounter (Signed)
Medical Assistant used [employer] to contact patient.  Interpreter Name: Berneda RoseOscar Interpreter #: [phone]  Medical Assistant left message on patient's home and cell voicemail. Voicemail states to give a call back to [place] with [facility] at [phone].

## 2016-07-02 ENCOUNTER — Telehealth: Payer: Self-pay | Admitting: Internal Medicine

## 2016-07-02 NOTE — Telephone Encounter (Signed)
Patient called back returning nurse's call to review pap smear results, please f/up

## 2016-07-03 NOTE — Telephone Encounter (Signed)
Refer to lab results note. 

## 2016-07-27 ENCOUNTER — Encounter: Payer: Self-pay | Admitting: Obstetrics and Gynecology

## 2016-07-27 ENCOUNTER — Encounter: Payer: Commercial Managed Care - PPO | Admitting: Obstetrics and Gynecology

## 2016-07-27 NOTE — Progress Notes (Signed)
Patient no show'ed to 07/27/2016 @ 1040am GYN visit  [name] MD Attending Center for [facility])

## 2016-08-06 ENCOUNTER — Telehealth: Payer: Self-pay | Admitting: *Deleted

## 2016-08-06 NOTE — Telephone Encounter (Signed)
Error

## 2016-08-06 NOTE — Telephone Encounter (Signed)
Patient spoke with Marcelino DusterMichelle RMA on 07/03/16 regarding results.

## 2017-02-19 ENCOUNTER — Other Ambulatory Visit: Payer: Self-pay | Admitting: Internal Medicine

## 2017-02-19 DIAGNOSIS — N644 Mastodynia: Secondary | ICD-10-CM

## 2017-02-24 ENCOUNTER — Other Ambulatory Visit: Payer: Self-pay | Admitting: Internal Medicine

## 2017-02-24 ENCOUNTER — Ambulatory Visit
Admission: RE | Admit: 2017-02-24 | Discharge: 2017-02-24 | Disposition: A | Discharge status: Home / Self Care | Payer: Commercial Managed Care - PPO | Source: Ambulatory Visit | Attending: Internal Medicine | Admitting: Internal Medicine

## 2017-02-24 DIAGNOSIS — N6310 Unspecified lump in the right breast, unspecified quadrant: Secondary | ICD-10-CM

## 2017-02-24 DIAGNOSIS — N644 Mastodynia: Secondary | ICD-10-CM

## 2017-03-12 ENCOUNTER — Telehealth: Payer: Self-pay

## 2017-03-12 NOTE — Telephone Encounter (Signed)
-----   Message from Pete Glatterawn T Langeland, MD sent at 02/25/2017  8:05 AM EST ----- Persistent stable right breast cyst, recd repeat bilat MM and  Right breast us in 6 months. thanks

## 2017-03-12 NOTE — Telephone Encounter (Signed)
CMA call to inform MM ultrasound results  Patient did not answer but left a Vm stating the results & if have any questions just to call back

## 2017-03-15 ENCOUNTER — Ambulatory Visit: Payer: Commercial Managed Care - PPO | Attending: Internal Medicine | Admitting: Internal Medicine

## 2017-03-15 VITALS — BP 107/63 | HR 68 | Temp 99.0°F | Resp 16 | Wt 157.0 lb

## 2017-03-15 DIAGNOSIS — N86 Erosion and ectropion of cervix uteri: Secondary | ICD-10-CM | POA: Diagnosis not present

## 2017-03-15 DIAGNOSIS — N319 Neuromuscular dysfunction of bladder, unspecified: Secondary | ICD-10-CM | POA: Diagnosis not present

## 2017-03-15 DIAGNOSIS — G0491 Myelitis, unspecified: Secondary | ICD-10-CM

## 2017-03-15 DIAGNOSIS — Z1329 Encounter for screening for other suspected endocrine disorder: Secondary | ICD-10-CM | POA: Diagnosis not present

## 2017-03-15 DIAGNOSIS — Z131 Encounter for screening for diabetes mellitus: Secondary | ICD-10-CM | POA: Insufficient documentation

## 2017-03-15 DIAGNOSIS — L918 Other hypertrophic disorders of the skin: Secondary | ICD-10-CM

## 2017-03-15 DIAGNOSIS — N898 Other specified noninflammatory disorders of vagina: Secondary | ICD-10-CM | POA: Insufficient documentation

## 2017-03-15 DIAGNOSIS — Z79899 Other long term (current) drug therapy: Secondary | ICD-10-CM | POA: Diagnosis not present

## 2017-03-15 DIAGNOSIS — G0490 Encephalitis and encephalomyelitis, unspecified: Secondary | ICD-10-CM | POA: Diagnosis not present

## 2017-03-15 LAB — CBC WITH DIFFERENTIAL/PLATELET
BASOS PCT: 0 %
Basophils Absolute: 0 cells/uL (ref 0–200)
EOS PCT: 1 %
Eosinophils Absolute: 71 cells/uL (ref 15–500)
HCT: 39.5 % (ref 35.0–45.0)
Hemoglobin: 12.9 g/dL (ref 11.7–15.5)
LYMPHS ABS: 1988 {cells}/uL (ref 850–3900)
Lymphocytes Relative: 28 %
MCH: 29.1 pg (ref 27.0–33.0)
MCHC: 32.7 g/dL (ref 32.0–36.0)
MCV: 89.2 fL (ref 80.0–100.0)
MPV: 13 fL — ABNORMAL HIGH (ref 7.5–12.5)
Monocytes Absolute: 426 cells/uL (ref 200–950)
Monocytes Relative: 6 %
NEUTROS ABS: 4615 {cells}/uL (ref 1500–7800)
NEUTROS PCT: 65 %
PLATELETS: 167 10*3/uL (ref 140–400)
RBC: 4.43 MIL/uL (ref 3.80–5.10)
RDW: 14.7 % (ref 11.0–15.0)
WBC: 7.1 10*3/uL (ref 3.8–10.8)

## 2017-03-15 LAB — TSH: TSH: 1.07 m[IU]/L

## 2017-03-15 LAB — BASIC METABOLIC PANEL WITH GFR
BUN: 12 mg/dL (ref 7–25)
CHLORIDE: 107 mmol/L (ref 98–110)
CO2: 22 mmol/L (ref 20–31)
Calcium: 8.9 mg/dL (ref 8.6–10.2)
Creat: 0.66 mg/dL (ref 0.50–1.10)
GFR, Est African American: >89 mL/min (ref >=60)
GFR, Est Non African American: >89 mL/min (ref >=60)
Glucose, Bld: 89 mg/dL (ref 65–99)
POTASSIUM: 3.8 mmol/L (ref 3.5–5.3)
SODIUM: 137 mmol/L (ref 135–146)

## 2017-03-15 LAB — POCT URINALYSIS DIPSTICK
Bilirubin, UA: NEGATIVE
Blood, UA: NEGATIVE
Glucose, UA: NEGATIVE
Ketones, UA: NEGATIVE
Leukocytes, UA: NEGATIVE
NITRITE UA: NEGATIVE
PH UA: 5.5 (ref 5.0–8.0)
PROTEIN UA: NEGATIVE
SPEC GRAV UA: 1.015 (ref 1.030–1.035)
UROBILINOGEN UA: 0.2 (ref ?–2.0)

## 2017-03-15 LAB — POCT URINE PREGNANCY: PREG TEST UR: NEGATIVE

## 2017-03-15 MED ORDER — OXYBUTYNIN CHLORIDE ER 10 MG PO TB24: 10.0000 mg | ORAL_TABLET | Freq: Every day | ORAL | 3 refills | Status: DC

## 2017-03-15 NOTE — Patient Instructions (Addendum)
Mantenimiento de la salud - Mujeres (Health Maintenance, Female) Un estilo de vida saludable y los cuidados preventivos pueden favorecer considerablemente a la salud y el bienestar. Pregunte a su mdico cul es el cronograma de exmenes peridicos apropiado para usted. Esta es una buena oportunidad para consultarlo sobre cmo prevenir enfermedades y mantenerse sano. Adems de los controles, hay muchas otras cosas que puede hacer usted mismo. Los expertos han realizado numerosas investigaciones sobre los cambios en el estilo de vida y las medidas de prevencin que, muy probablemente, lo ayudarn a mantenerse sano. Solicite a su mdico ms informacin. EL PESO Y LA DIETA Consuma una dieta saludable.   Asegrese de incluir muchas verduras, frutas, productos lcteos de bajo contenido de grasa y protenas magras.  No consuma muchos alimentos de alto contenido de grasas slidas, azcares agregados o sal.  Realice actividad fsica con regularidad. Esta es una de las prcticas ms importantes que puede hacer por su salud.  La mayora de los adultos deben hacer ejercicio durante al menos 150minutos por semana. El ejercicio debe aumentar la frecuencia cardaca y provocar la transpiracin (ejercicio de intensidad moderada).  La mayora de los adultos tambin deben hacer ejercicios de elongacin al menos dos veces a la semana. Agregue esto al su plan de ejercicio de intensidad moderada. Mantenga un peso saludable.   El ndice de masa corporal (IMC) es una medida que puede utilizarse para identificar posibles problemas de peso. Proporciona una estimacin de la grasa corporal basndose en el peso y la altura. Su mdico puede ayudarle a determinar su IMC y a lograr o mantener un peso saludable.  Para las mujeres de 20aos o ms:  Un IMC menor de 18,5 se considera bajo peso.  Un IMC entre 18,5 y 24,9 es normal.  Un IMC entre 25 y 29,9 se considera sobrepeso.  Un IMC de 30 o ms se considera  obesidad. Observe los niveles de colesterol y lpidos en la sangre.   Debe comenzar a realizarse anlisis de lpidos y colesterol en la sangre a los 20aos y luego repetirlos cada 5aos.  Es posible que necesite controlar los niveles de colesterol con mayor frecuencia si:  Sus niveles de lpidos y colesterol son altos.  Es mayor de 50aos.  Presenta un alto riesgo de padecer enfermedades cardacas. DETECCIN DE CNCER Cncer de pulmn   Se recomienda realizar exmenes de deteccin de cncer de pulmn a personas adultas entre 55 y 80 aos que estn en riesgo de desarrollar cncer de pulmn por sus antecedentes de consumo de tabaco.  Se recomienda una tomografa computarizada de baja dosis de los pulmones todos los aos a las personas que:  Fuman actualmente.  Hayan dejado el hbito en algn momento en los ltimos 15aos.  Hayan fumado durante 30aos un paquete diario. Un paquete-ao equivale a fumar un promedio de un paquete de cigarrillos diario durante un ao.  Los exmenes de deteccin anuales deben continuar hasta que hayan pasado 15aos desde que dej de fumar.  Ya no debern realizarse si tiene un problema de salud que le impida recibir tratamiento para el cncer de pulmn. Cncer de mama   Practique la autoconciencia de la mama. Esto significa reconocer la apariencia normal de sus mamas y cmo las siente.  Tambin significa realizar autoexmenes regulares de las mamas. Informe a su mdico sobre cualquier cambio, sin importar cun pequeo sea.  Si tiene entre 20 y 30 aos, un mdico debe realizarle un examen clnico de las mamas como parte del examen regular   de salud, cada 1 a 3aos.  Si tiene 40aos o ms, debe realizarse un examen clnico de las mamas todos los aos. Tambin considere realizarse una radiografa de las mamas (mamografa) todos los aos.  Si tiene antecedentes familiares de cncer de mama, hable con su mdico para someterse a un estudio  gentico.  Si tiene alto riesgo de padecer cncer de mama, hable con su mdico para someterse a una resonancia magntica y una mamografa todos los aos.  La evaluacin del gen del cncer de mama (BRCA) se recomienda a mujeres que tengan familiares con cnceres relacionados con el BRCA. Los cnceres relacionados con el BRCA incluyen los siguientes:  Mama.  Ovario.  Trompas.  Cnceres de peritoneo.  Los resultados de la evaluacin determinarn la necesidad de asesoramiento gentico y de anlisis de BRCA1 y BRCA2. Cncer de cuello del tero  El mdico puede recomendarle que se haga pruebas peridicas de deteccin de cncer de los rganos de la pelvis (ovarios, tero y vagina). Estas pruebas incluyen un examen plvico, que abarca controlar si se produjeron cambios microscpicos en la superficie del cuello del tero (prueba de Papanicolaou). Pueden recomendarle que se haga estas pruebas cada 3aos, a partir de los 21aos.  A las mujeres que tienen entre 30 y 65aos, los mdicos pueden recomendarles que se sometan a exmenes plvicos y pruebas de Papanicolaou cada 3aos, o a la prueba de Papanicolaou y el examen plvico en combinacin con estudios de deteccin del virus del papiloma humano (VPH) cada 5aos. Algunos tipos de VPH aumentan el riesgo de padecer cncer de cuello del tero. La prueba para la deteccin del VPH tambin puede realizarse a mujeres de cualquier edad cuyos resultados de la prueba de Papanicolaou no sean claros.  Es posible que otros mdicos no recomienden exmenes de deteccin a mujeres no embarazadas que se consideran sujetos de bajo riesgo de padecer cncer de pelvis y que no tienen sntomas. Pregntele al mdico si un examen plvico de deteccin es adecuado para usted.  Si ha recibido un tratamiento para el cncer cervical o una enfermedad que podra causar cncer, necesitar realizarse una prueba de Papanicolaou y controles durante al menos 20 aos de concluido el  tratamiento. Si no se ha hecho el Papanicolaou con regularidad, debern volver a evaluarse los factores de riesgo (como tener un nuevo compaero sexual), para determinar si debe realizarse los estudios nuevamente. Algunas mujeres sufren problemas mdicos que aumentan la probabilidad de contraer cncer de cuello del tero. En estos casos, el mdico podr indicar que se realicen controles y pruebas de Papanicolaou con ms frecuencia. Cncer colorrectal   Este tipo de cncer puede detectarse y a menudo prevenirse.  Por lo general, los estudios de rutina se deben comenzar a hacer a partir de los 50 aos y hasta los 75 aos.  Sin embargo, el mdico podr aconsejarle que lo haga antes, si tiene factores de riesgo para el cncer de colon.  Tambin puede recomendarle que use un kit de prueba para hallar sangre oculta en la materia fecal.  Es posible que se use una pequea cmara en el extremo de un tubo para examinar directamente el colon (sigmoidoscopia o colonoscopia) a fin de detectar formas tempranas de cncer colorrectal.  Los exmenes de rutina generalmente comienzan a los 50aos.  El examen directo del colon se debe repetir cada 5 a 10aos hasta los 75aos. Sin embargo, es posible que se realicen exmenes con mayor frecuencia, si se detectan formas tempranas de plipos precancerosos o pequeos bultos.   Cncer de piel   Revise la piel de la cabeza a los pies con regularidad.  Informe a su mdico si aparecen nuevos lunares o los que tiene se modifican, especialmente en su forma y color.  Tambin notifique al mdico si tiene un lunar que es ms grande que el tamao de una goma de lpiz.  Siempre use pantalla solar. Aplique pantalla solar de manera libre y repetida a lo largo del da.  Protjase usando mangas y pantalones largos, un sombrero de ala ancha y gafas para el sol, siempre que se encuentre en el exterior. ENFERMEDADES CARDACAS, DIABETES E HIPERTENSIN ARTERIAL  La hipertensin  arterial causa enfermedades cardacas y aumenta el riesgo de ictus. La hipertensin arterial es ms probable en los siguientes casos:  Las personas que tienen la presin arterial en el extremo del rango normal (100-139/85-89 mm Hg).  Las personas con sobrepeso u obesidad.  Las personas afroamericanas.  Si usted tiene entre 18 y 39 aos, debe medirse la presin arterial cada 3 a 5 aos. Si usted tiene 40 aos o ms, debe medirse la presin arterial todos los aos. Debe medirse la presin arterial dos veces: una vez cuando est en un hospital o una clnica y la otra vez cuando est en otro sitio. Registre el promedio de las dos mediciones. Para controlar su presin arterial cuando no est en un hospital o una clnica, puede usar lo siguiente:  Una mquina automtica para medir la presin arterial en una farmacia.  Un monitor para medir la presin arterial en el hogar.  Si tiene entre 55 y 79 aos, consulte a su mdico si debe tomar aspirina para prevenir el ictus.  Realcese exmenes de deteccin de la diabetes con regularidad. Esto incluye la toma de una muestra de sangre para controlar el nivel de azcar en la sangre durante el ayuno.  Si tiene un peso normal y un bajo riesgo de padecer diabetes, realcese este anlisis cada tres aos despus de los 45aos.  Si tiene sobrepeso y un alto riesgo de padecer diabetes, considere someterse a este anlisis antes o con mayor frecuencia. PREVENCIN DE INFECCIONES HepatitisB   Si tiene un riesgo ms alto de contraer hepatitis B, debe someterse a un examen de deteccin de este virus. Se considera que tiene un alto riesgo de contraer hepatitis B si:  Naci en un pas donde la hepatitis B es frecuente. Pregntele a su mdico qu pases son considerados de alto riesgo.  Sus padres nacieron en un pas de alto riesgo y usted no recibi una vacuna que lo proteja contra la hepatitis B (vacuna contra la hepatitis B).  Tiene VIH o sida.  Usa agujas  para inyectarse drogas.  Vive con alguien que tiene hepatitis B.  Ha tenido sexo con alguien que tiene hepatitis B.  Recibe tratamiento de hemodilisis.  Toma ciertos medicamentos para el cncer, trasplante de rganos y afecciones autoinmunitarias. Hepatitis C   Se recomienda un anlisis de sangre para:  Todos los que nacieron entre 1945 y 1965.  Todas las personas que tengan un riesgo de haber contrado hepatitis C. Enfermedades de transmisin sexual (ETS).   Debe realizarse pruebas de deteccin de enfermedades de transmisin sexual (ETS), incluidas gonorrea y clamidia si:  Es sexualmente activo y es menor de 24aos.  Es mayor de 24aos, y el mdico le informa que corre riesgo de tener este tipo de infecciones.  La actividad sexual ha cambiado desde que le hicieron la ltima prueba de deteccin y tiene un riesgo   tiene riesgo.  Si no tiene el VIH, pero corre riesgo de infectarse por el virus, se recomienda tomar diariamente un medicamento recetado para evitar la infeccin. Esto se conoce como profilaxis previa a la exposicin. Se considera que est en riesgo si:  Es [place] sexualmente y no [place] preservativos habitualmente o no conoce el estado del VIH de sus Advertising copywriter.  Se inyecta drogas.  Es [place] sexualmente con Ardelia Mems pareja que tiene VIH. Consulte a su mdico para saber si tiene un alto riesgo de infectarse por el VIH. Si opta por comenzar la profilaxis previa a la exposicin, primero debe realizarse anlisis de deteccin del VIH. Luego, le harn anlisis cada 79mses mientras est tomando los medicamentos para la profilaxis previa a la exposicin. EThe[facility] Inc Si es premenopusica y puede quedar eLolita solicite a su mdico asesoramiento previo a la concepcin.  Si puede quedar embarazada, tome 400 a [id number](mcg) de cido fAnheuser-Busch  Si desea evitar el embarazo, hable con su mdico sobre el  control de la natalidad (anticoncepcin). OSTEOPOROSIS Y MENOPAUSIA  La osteoporosis es una enfermedad en la que los huesos pierden los minerales y la fuerza por el avance de la edad. El resultado pueden ser fracturas graves en los h[place]ottonwood El riesgo de osteoporosis puede identificarse con uArdelia Memsprueba de densidad sea.  Si tiene 65aos o ms, o si est en riesgo de sufrir osteoporosis y fracturas, pregunte a su mdico si debe someterse a exmenes.  Consulte a su mdico si debe tomar un suplemento de calcio o de vitamina D para reducir el riesgo de osteoporosis.  La menopausia puede presentar ciertos sntomas fsicos y rGaffer  La terapia de reemplazo hormonal puede reducir algunos de estos sntomas y rGaffer Consulte a su mdico para saber si la terapia de reemplazo hormonal es conveniente para usted. INSTRUCCIONES PARA EL CUIDADO EN EL HOGAR  Realcese los estudios de rutina de la salud, dentales y de lPublic librarian  [place]  No consuma ningn producto que contenga tabaco, lo que incluye cigarrillos, tabaco de mHigher education careers advisero cPsychologist, sport and exercise  Si est embarazada, no beba alcohol.  Si est amamantando, reduzca el consumo de alcohol y la frecuencia con la que consume.  Si es mujer y no est embarazada limite el consumo de alcohol a no ms de 1 medida por da. Una medida equivale a 12onzas de cerveza, 5onzas de vino o 1onzas de bebidas alcohlicas de alta graduacin.  No consuma drogas.  No comparta agujas.  Solicite ayuda a su mdico si necesita apoyo o informacin para abandonar las drogas.  Informe a su mdico si a menudo se siente deprimido.  Notifique a su mdico si alguna vez ha sido vctima de abuso o si no se siente seguro en su hogar. Esta informacin no tiene cMarine scientistel consejo del mdico. Asegrese de hacerle al mdico cualquier pregunta que tenga. Document Released: 12/03/2011 Document Revised: 01/04/2015 Document Reviewed:  09/17/2015 Elsevier Interactive Patient Education  2017 [place](Urinary Incontinence) La incontinencia urinaria es la prdida involuntaria de orina de la vejiga. CAUSAS Hay muchas causas de incontinencia urinaria. Ellas son:  Medicamentos.  Infecciones.  Agrandamiento prosttico que causa un aumento del flujo de oZimbabwede la vejiga.  Ciruga.  Enfermedades neurolgicas.  Factores emocionales. SIGNOS Y SNTOMAS Hay cuatro tipos de incontinencia urinaria: 1. Incontinencia urinaria de urgencia: es la prdida involuntaria de orina antes de llegar al bao. Hay una urgencia repentina de  orinar pero no llega a tiempo al bao. 2. Incontinencia por estrs: es la prdida repentina de orina con cualquier actividad que fuerza a Art gallery manager orina. La causa ms frecuente son los cambios anatmicos en la pelvis y las zonas del esfnter. 3. Incontinencia por rebosamiento: es la prdida de orina por una obstruccin de la apertura de la vejiga. Esto causa un retroceso de la orina y una acumulacin de presin dentro de la vejiga. Cuando la presin dentro de la vejiga excede la presin que Engineer, manufacturing systems, la orina rebalsa y causa incontinencia, similar al desbordamiento de un dique. 4. Incontinencia total: es la prdida de orina como resultado de la incapacidad de Financial controller orina dentro de la vejiga. DIAGNSTICO La evaluacin de la causa puede requerir:  Ardelia Mems historia mdica y obsttrica exhaustiva y [place].  Examen fsico completo.  Anlisis de laboratorio como un urocultivo y [place]. Cuando se indican estudios adicionales, pueden incluirse:  Una ecografa.  Radiografas de vejiga y riones.  Una cistoscopa. Consiste en un examen de la vejiga utilizando un telescopio pequeo.  Estudios urodinmicos para comprobar la funcin nerviosa de la vejiga y Social worker. TRATAMIENTO El tratamiento de la incontinencia urinaria depende de la causa:  Para la  incontinencia urinaria causada por una infeccin urinaria, le indicarn antibiticos. Si la incontinencia urinaria se relaciona con los [employer] toma, el mdico podr Public relations account executive.  Para la incontinencia por estrs, en necesaria una ciruga para restablecer el soporte anatmico de la vejiga o el esfnter, o ambos, lo que Clinical research associate.  Para la incontinencia por rebosamiento causada por una prstata agrandada, una operacin para abrir el canal a travs de la prstata agrandada permitir que el flujo de orina salga de la vejiga. En las mujeres con fibromas, ser necesaria una histerectoma.  Para la incontinencia total, una ciruga del esfnter podr ayudar. Puede ser necesario colocar un esfnter urinario artificial (se coloca un manguito inflable alrededor de Geologist, engineering). En las mujeres que hayan desarrollado un pasaje similar a un orificio entre la vejiga y la vagina (fstula vesicovaginal ), ser necesario realizar una ciruga para cerrar la fstula. INSTRUCCIONES PARA EL CUIDADO EN EL HOGAR  La higiene diaria normal y el uso regular de apsitos o paales para adultos cambiados con regularidad ayudan a prevenir olores y daos en la piel.  Evite la cafena. Puede sobreestimular la vejiga.  Use el bao con regularidad. Trate de ir al bao cada 2  3 horas, aunque no sienta la necesidad. Tmese el tiempo para vaciar la vejiga completamente. Despus de orinar, espere un minuto. Luego trate de orinar nuevamente.  Para las causas que implican una disfuncin nerviosa, lleve un registro de los medicamentos que toma y un diario de las veces que va al bao. SOLICITE ATENCIN MDICA SI:  Despus del procedimiento, experimenta un empeoramiento del dolor en vez de mejorar.  La incontinencia empeora en vez de mejorar. SOLICITE ATENCIN MDICA DE INMEDIATO SI:  Le sube la fiebre o tiene escalofros.  No puede orinar.  Observa irritacin en la ingle o baja hacia los  muslos. ASEGRESE DE QUE:  Comprende estas instrucciones.  Controlar su afeccin.  Recibir ayuda de inmediato si no mejora o si empeora. Esta informacin no tiene Marine scientist el consejo del mdico. Asegrese de hacerle al mdico cualquier pregunta que tenga. Document Released: 12/14/2005 Document Revised: 01/04/2015 Document Reviewed: 05/23/2013 Elsevier Interactive Patient Education  2017 [employer].

## 2017-03-15 NOTE — Addendum Note (Signed)
Addended byDierdre Searles: [name] on: 03/15/2017 05:16 PM   Modules accepted: Orders

## 2017-03-15 NOTE — Progress Notes (Signed)
[name], is a 46 y.o. female  NGE:[id number]  MWN:[id number]  DOB - 1971/11/21  Chief Complaint  Patient presents with  . Vaginal Discharge        Subjective:   [name] is a 46 y.o. female here today for a follow up visit.  Last seen 06/10/16. c/o of vaginal discharge x 2 months, yellow discharge, mild dysuria. Last menses 2 wks ago, sexual active w/  1  Wk ago, 1 partner.   Notes 05/18/16; She also mentions a history of transverse myelitis which causes neurogenic bladder for which she has appt w/  urology April 2.   She had hx of nml papsmear last year, but noted cervical ulcers at time. She did not know she had obgy appt in July so missed it. Would like  referral to ob.    Also c/o of skin tag back of her left thigh which is growing, bothersome, and requested referral for it to be removed.  Patient has No headache, No chest pain, No abdominal pain - No Nausea, No new weakness tingling or numbness, No Cough /sob.   Young son w/ her today.  Interpreter was used to communicate directly with patient for the entire encounter including providing detailed patient instructions.  No problems updated.  ALLERGIES: No Known Allergies  PAST MEDICAL HISTORY: Past Medical History:  Diagnosis Date  . Transverse myelitis (HCC)     MEDICATIONS AT HOME: Prior to Admission medications   Medication Sig Start Date End Date Taking? Authorizing Provider  Multiple Vitamin (MULTIVITAMIN WITH MINERALS) TABS tablet Take 1 tablet by mouth daily.   Yes Historical Provider, MD  ibuprofen (ADVIL,MOTRIN) 800 MG tablet Take 1 tablet (800 mg total) by mouth every 8 (eight) hours as needed. Patient not taking: Reported on 03/15/2017 06/11/14   Doris Cheadle, MD  metroNIDAZOLE (FLAGYL) 500 MG tablet Take 1 tablet (500 mg total) by mouth 2 (two) times daily. Patient not taking: Reported on 03/15/2017 06/11/16   Pete Glatter, MD     Objective:   Vitals:   03/15/17 1606  BP:  107/63  Pulse: 68  Resp: 16  Temp: 99 F (37.2 C)  TempSrc: Oral  SpO2: 98%  Weight: 157 lb (71.2 kg)    Exam General appearance : Awake, alert, not in any distress. Speech Clear. Not toxic looking, obese, pleasant. HEENT: Atraumatic and Normocephalic, pupils equally reactive to light. Neck: supple, no JVD.  Chest:Good air entry bilaterally, no added sounds. CVS: S1 S2 regular, no murmurs/gallups or rubs. Abdomen: Bowel sounds active, Non tender and not distended with no gaurding, rigidity or rebound. Extremities: B/L Lower Ext shows no edema, both legs are warm to touch Neurology: Awake alert, and oriented X 3, CN II-XII grossly intact, Non focal Skin:No Rash  Data Review Lab Results  Component Value Date   HGBA1C 5.6 05/18/2016    Depression screen [facility] 2/9 03/15/2017 06/10/2016 05/18/2016 08/02/2013  Decreased Interest 1 0 0 0  Down, Depressed, Hopeless 0 0 0 0  PHQ - 2 Score 1 0 0 0  Altered sleeping - 0 - -  Tired, decreased energy - 0 - -  Change in appetite - 0 - -  Feeling bad or failure about yourself  - 0 - -  Trouble concentrating - 0 - -  Moving slowly or fidgety/restless - 0 - -  Suicidal thoughts - 0 - -  PHQ-9 Score - 0 - -  Difficult doing work/chores - Not difficult at all - -  Assessment & Plan   1. Vaginal discharge X 2 months, w/ dysuria - Urine cytology ancillary only - Urinalysis Dipstick - Neg - POCT urine pregnancy - chk bmp/cbc  2. Hx of transverse myelitis, w/ residual neurogenic bladder  - renewed ditropan 10mg  qd - has appt w/ urology April 2, per pt.  3. Hx of neg pap smear, but noted cerv ulcer at time. - missed July 2017 referral to ob, will resend.  4. Health maintenance labs, including dm screening, thyroid screening,  Patient have been counseled extensively about nutrition and exercise  Return in about 6 months (around 09/15/2017), or if symptoms worsen or fail to improve.  The patient was given clear instructions to go  to ER or return to medical center if symptoms don't improve, worsen or new problems develop. The patient verbalized understanding. The patient was told to call to get lab results if they haven't heard anything in the next week.   This note has been created with Education officer, environmentalDragon speech recognition software and smart phrase technology. Any transcriptional errors are unintentional.   Pete Glatterawn T Marshelle Bilger, MD, MBA/MHA [facility] and [facility] [place], [place] [phone]   03/15/2017, 4:07 PM

## 2017-03-16 ENCOUNTER — Telehealth: Payer: Self-pay

## 2017-03-16 LAB — HEMOGLOBIN A1C
HEMOGLOBIN A1C: 5.1 % (ref <5.7)
MEAN PLASMA GLUCOSE: 100 mg/dL

## 2017-03-16 LAB — HIV ANTIBODY (ROUTINE TESTING W REFLEX): HIV: NONREACTIVE

## 2017-03-16 NOTE — Telephone Encounter (Signed)
CMA call to go over lab results   Patient did not answer but left a VM stating the reason of the call & to call back

## 2017-03-16 NOTE — Telephone Encounter (Signed)
-----   Message from Pete Glatterawn T Langeland, MD sent at 03/16/2017  8:33 AM EDT ----- Please call. hiv neg, no diabetes or prediabetes - keep it up! Thyroid function nml,  Kidney and blood count nml, urine negative, not pregnant. Waiting on urine cytology /infection r/o still. thanks

## 2017-03-16 NOTE — Telephone Encounter (Signed)
CMA return patient call again patient did not answer but left a detailed message & if have any questions just to call back

## 2017-03-16 NOTE — Telephone Encounter (Signed)
Patient returned nurse' call regarding her results. Pt asked that if she doesn't pick again, because of work, to please leave a detailed message.   Thank you.

## 2017-03-17 LAB — URINE CYTOLOGY ANCILLARY ONLY
CHLAMYDIA, DNA PROBE: NEGATIVE
NEISSERIA GONORRHEA: NEGATIVE
Trichomonas: NEGATIVE

## 2017-03-19 LAB — URINE CYTOLOGY ANCILLARY ONLY: Candida vaginitis: NEGATIVE

## 2017-03-20 ENCOUNTER — Other Ambulatory Visit: Payer: Self-pay | Admitting: Internal Medicine

## 2017-03-20 MED ORDER — METRONIDAZOLE 500 MG PO TABS: 500.0000 mg | ORAL_TABLET | Freq: Two times a day (BID) | ORAL | 0 refills | Status: AC

## 2017-03-23 ENCOUNTER — Telehealth: Payer: Self-pay

## 2017-04-05 ENCOUNTER — Telehealth: Payer: Self-pay | Admitting: Internal Medicine

## 2017-04-05 MED ORDER — OXYBUTYNIN CHLORIDE ER 10 MG PO TB24: 10.0000 mg | ORAL_TABLET | Freq: Every day | ORAL | 0 refills | Status: AC

## 2017-04-05 NOTE — Telephone Encounter (Signed)
Oxybutynin refilled.

## 2017-04-05 NOTE — Telephone Encounter (Signed)
Pharmacy called requesting a refill for oxybutynin (DITROPAN XL) 10 MG 24 hr tablet  Please f/u.

## 2017-05-03 ENCOUNTER — Ambulatory Visit (INDEPENDENT_AMBULATORY_CARE_PROVIDER_SITE_OTHER): Payer: Commercial Managed Care - PPO | Admitting: Obstetrics & Gynecology

## 2017-05-03 ENCOUNTER — Encounter: Payer: Self-pay | Admitting: Obstetrics & Gynecology

## 2017-05-03 VITALS — BP 104/53 | HR 55 | Wt 158.5 lb

## 2017-05-03 DIAGNOSIS — N86 Erosion and ectropion of cervix uteri: Secondary | ICD-10-CM

## 2017-05-03 NOTE — Progress Notes (Signed)
   Subjective:    Patient ID: Jessica FothergillMartha E. Cameron, female    DOB: 1971-05-11, 46 y.o.   MRN: [id number]  HPI She is here for a referral as her primary care MD in 2017noted "some dark discolorations on her cervix". She denies any gyn problems today.   Review of Systems Pap 2014 and 7/01 were normal with negative HR HPV    Objective:   Physical Exam [id number] Interpreter used for interaction Her cervix is entirely normal. The ectropion is prominent.       Assessment & Plan:  Normal cervix- reassurance given Her next pap should be in 2020 She is welcome to come here to go back to her primary care MD.

## 2017-05-13 ENCOUNTER — Encounter: Payer: Self-pay | Admitting: Internal Medicine

## 2017-05-14 ENCOUNTER — Encounter: Payer: Self-pay | Admitting: Internal Medicine

## 2017-05-17 ENCOUNTER — Encounter: Payer: Self-pay | Admitting: Internal Medicine
